# Patient Record
Sex: Male | Born: 1955 | Race: White | Hispanic: No | Marital: Married | Smoking: Former smoker
Health system: Western US, Academic
[De-identification: ages and names within clinical notes are randomized; demographics above are authoritative.]

## PROBLEM LIST (undated history)

## (undated) DIAGNOSIS — M1990 Unspecified osteoarthritis, unspecified site: Secondary | ICD-10-CM

## (undated) DIAGNOSIS — C801 Malignant (primary) neoplasm, unspecified: Secondary | ICD-10-CM

## (undated) DIAGNOSIS — I10 Essential (primary) hypertension: Secondary | ICD-10-CM

## (undated) DIAGNOSIS — M549 Dorsalgia, unspecified: Secondary | ICD-10-CM

## (undated) HISTORY — DX: Essential (primary) hypertension: I10

## (undated) HISTORY — PX: HIP ARTHROPLASTY: SHX981

## (undated) HISTORY — DX: Dorsalgia, unspecified: M54.9

## (undated) MED ORDER — ATORVASTATIN CALCIUM 20 MG OR TABS
20.0000 mg | ORAL_TABLET | Freq: Every day | ORAL | 0 refills | Status: AC
Start: 2023-03-31 — End: ?

## (undated) MED ORDER — ATORVASTATIN CALCIUM 40 MG OR TABS
40.00 mg | ORAL_TABLET | Freq: Every day | ORAL | 0 refills | Status: AC
Start: 2020-07-17 — End: ?

## (undated) MED ORDER — POTASSIUM CHLORIDE CRYS CR 20 MEQ OR TBCR
20.00 meq | EXTENDED_RELEASE_TABLET | Freq: Every day | ORAL | 2 refills | Status: AC
Start: 2019-08-27 — End: ?

## (undated) MED ORDER — CHLORTHALIDONE 50 MG OR TABS
ORAL_TABLET | ORAL | 0 refills | Status: AC
Start: 2021-10-25 — End: ?

## (undated) MED ORDER — ATORVASTATIN CALCIUM 20 MG OR TABS
20.0000 mg | ORAL_TABLET | Freq: Every day | ORAL | 0 refills | Status: AC
Start: 2023-03-24 — End: ?

## (undated) MED ORDER — LEVOTHYROXINE SODIUM 50 MCG OR TABS
ORAL_TABLET | ORAL | 0 refills | Status: AC
Start: 2020-04-22 — End: ?

## (undated) MED ORDER — DIAZEPAM 5 MG OR TABS
ORAL_TABLET | ORAL | 0 refills | Status: AC
Start: 2021-07-26 — End: ?

## (undated) MED ORDER — ATORVASTATIN CALCIUM 40 MG OR TABS
ORAL_TABLET | ORAL | 0 refills | Status: AC
Start: 2020-07-23 — End: ?

## (undated) MED ORDER — COLCHICINE 0.6 MG OR TABS
ORAL_TABLET | ORAL | 0 refills | Status: AC
Start: 2019-06-11 — End: ?

## (undated) MED ORDER — TRAMADOL HCL 50 MG OR TABS
50.0000 mg | ORAL_TABLET | Freq: Four times a day (QID) | ORAL | 0 refills | Status: AC | PRN
Start: 2021-07-28 — End: ?

## (undated) MED ORDER — METOPROLOL SUCCINATE 200 MG OR TB24
ORAL_TABLET | ORAL | 0 refills | Status: AC
Start: 2019-08-20 — End: ?

## (undated) MED ORDER — CHLORTHALIDONE 50 MG OR TABS
ORAL_TABLET | ORAL | 0 refills | Status: AC
Start: 2021-11-06 — End: ?

## (undated) MED ORDER — LEVOTHYROXINE SODIUM 50 MCG OR TABS
50.00 ug | ORAL_TABLET | Freq: Every day | ORAL | Status: AC
Start: 2018-04-24 — End: ?

## (undated) MED ORDER — COLCHICINE 0.6 MG OR TABS
ORAL_TABLET | ORAL | 0 refills | Status: AC
Start: 2019-06-15 — End: ?

## (undated) MED ORDER — ATORVASTATIN CALCIUM 20 MG OR TABS
20.0000 mg | ORAL_TABLET | Freq: Every day | ORAL | 0 refills | Status: AC
Start: 2023-03-28 — End: ?

## (undated) MED ORDER — ATORVASTATIN CALCIUM 40 MG OR TABS
40.00 mg | ORAL_TABLET | Freq: Every day | ORAL | 0 refills | Status: AC
Start: 2020-09-04 — End: ?

## (undated) MED ORDER — CHLORTHALIDONE 50 MG OR TABS
50.0000 mg | ORAL_TABLET | Freq: Every day | ORAL | 3 refills | Status: AC
Start: 2021-10-29 — End: ?

## (undated) MED ORDER — TRAMADOL HCL 50 MG OR TABS
ORAL_TABLET | ORAL | 0 refills | Status: AC
Start: 2022-01-10 — End: ?

## (undated) MED ORDER — TRAMADOL HCL 50 MG OR TABS
50.0000 mg | ORAL_TABLET | Freq: Four times a day (QID) | ORAL | 0 refills | Status: AC | PRN
Start: 2022-01-11 — End: ?

## (undated) MED ORDER — COLCHICINE 0.6 MG OR TABS
0.60 mg | ORAL_TABLET | Freq: Every day | ORAL | Status: AC
Start: 2020-08-31 — End: ?

## (undated) MED ORDER — ATORVASTATIN CALCIUM 40 MG OR TABS
ORAL_TABLET | ORAL | 0 refills | Status: AC
Start: 2020-04-22 — End: ?

---

## 1999-12-20 HISTORY — PX: VASECTOMY: SHX75

## 2003-06-10 LAB — HM COLONOSCOPY

## 2007-12-20 HISTORY — PX: COLONOSCOPY: SHX174

## 2008-06-09 ENCOUNTER — Ambulatory Visit: Payer: Self-pay | Admitting: Gastroenterology

## 2010-11-29 ENCOUNTER — Ambulatory Visit: Payer: Self-pay

## 2011-09-27 ENCOUNTER — Ambulatory Visit: Payer: Self-pay | Admitting: Family Medicine

## 2012-05-09 ENCOUNTER — Ambulatory Visit: Payer: Self-pay | Admitting: Family Medicine

## 2015-01-13 LAB — BASIC METABOLIC PANEL
BUN: 14 mg/dL (ref 4–21)
Creatinine: 1.2 mg/dL (ref 0.6–1.3)
GLUCOSE: 102 mg/dL
Potassium: 4.5 mmol/L (ref 3.4–5.3)
Sodium: 142 mmol/L (ref 137–147)

## 2015-01-13 LAB — HEPATIC FUNCTION PANEL
ALT: 30 U/L (ref 10–40)
AST: 27 U/L (ref 14–40)

## 2015-01-13 LAB — CBC AND DIFFERENTIAL
HCT: 46 % (ref 41–53)
Hemoglobin: 16 g/dL (ref 13.5–17.5)
Platelets: 191 10*3/uL (ref 150–399)
WBC: 5.9 10^3/mL

## 2015-01-13 LAB — TSH: TSH: 7.08 u[IU]/mL — AB (ref 0.41–5.90)

## 2015-08-10 ENCOUNTER — Ambulatory Visit (INDEPENDENT_AMBULATORY_CARE_PROVIDER_SITE_OTHER): Payer: 59 | Admitting: Family Medicine

## 2015-08-10 ENCOUNTER — Encounter: Payer: Self-pay | Admitting: Family Medicine

## 2015-08-10 VITALS — BP 120/80 | HR 77 | Temp 98.3°F | Resp 16 | Ht 76.0 in | Wt 234.0 lb

## 2015-08-10 DIAGNOSIS — M542 Cervicalgia: Secondary | ICD-10-CM

## 2015-08-10 DIAGNOSIS — E785 Hyperlipidemia, unspecified: Secondary | ICD-10-CM | POA: Insufficient documentation

## 2015-08-10 DIAGNOSIS — R41840 Attention and concentration deficit: Secondary | ICD-10-CM | POA: Insufficient documentation

## 2015-08-10 DIAGNOSIS — M79673 Pain in unspecified foot: Secondary | ICD-10-CM | POA: Insufficient documentation

## 2015-08-10 MED ORDER — CYCLOBENZAPRINE HCL 5 MG PO TABS: 5.0000 mg | ORAL_TABLET | Freq: Three times a day (TID) | ORAL | Status: DC | PRN

## 2015-08-10 NOTE — Progress Notes (Signed)
Patient: Tristan Clark Male    DOB: March 20, 1956   59 y.o.   MRN: [id number] Visit Date: 08/10/2015  Today's Provider: Lelon Huh, MD   Chief Complaint  Patient presents with  . Neck Pain   Subjective:    Neck Pain  This is a new problem. The current episode started in the past 7 days. The problem occurs constantly. The problem has been gradually worsening. The pain is associated with an unknown factor. The pain is present in the left side. The quality of the pain is described as shooting. The pain is at a severity of 3/10. The pain is mild. The symptoms are aggravated by coughing, bending, sneezing and twisting. Pertinent negatives include no chest pain, headaches, numbness, pain with swallowing, tingling or weakness. He has tried NSAIDs for the symptoms. The treatment provided mild relief.       Allergies  Allergen Reactions  . Amoxicillin Rash   Previous Medications   ATOMOXETINE (STRATTERA) 80 MG CAPSULE    Take 1 capsule by mouth daily. According to starter pack   EPINEPHRINE (EPIPEN 2-PAK) 0.3 MG/0.3 ML IJ SOAJ INJECTION    Inject 1 Dose as directed as needed. For severe allergic reations   KRILL OIL 300 MG CAPS    Take 1 capsule by mouth daily.   LORATADINE (CLARITIN) 10 MG TABLET    Take 1 tablet by mouth daily.   RED YEAST RICE 600 MG TABS    Take 1 tablet by mouth daily.   VALACYCLOVIR (VALTREX) 1000 MG TABLET    Take 1 tablet by mouth 2 (two) times daily as needed.    Review of Systems  Cardiovascular: Negative for chest pain and palpitations.  Musculoskeletal: Positive for neck pain and neck stiffness. Negative for myalgias, back pain and joint swelling.  Neurological: Negative for dizziness, tingling, speech difficulty, weakness, light-headedness, numbness and headaches.  Psychiatric/Behavioral: Positive for sleep disturbance.    Social History  Substance Use Topics  . Smoking status: Former Research scientist (life sciences)  . Smokeless tobacco: Not on file  . Alcohol Use: 0.0  oz/week    0 Standard drinks or equivalent per week     Comment: occasional use;  2-3 times a week drinks a Bourbon   Objective:   BP 120/80 mmHg  Pulse 77  Temp(Src) 98.3 F (36.8 C) (Oral)  Resp 16  Ht 6\' 4"  (1.93 m)  Wt 234 lb (106.142 kg)  BMI 28.50 kg/m2  SpO2 97%  Physical Exam   General Appearance:    Alert, cooperative, no distress  Eyes:    PERRL, conjunctiva/corneas clear, EOM's intact       Lungs:     Clear to auscultation bilaterally, respirations unlabored  Heart:    Regular rate and rhythm  Neurologic:   Awake, alert, oriented x 3. No apparent focal neurological           defect.   Musc:   Tender along superior aspect of left scapula and adjacent trapezius. Pain elicited with head rotation to right and right lateral flexion. No swelling, no gross deformities. FROM of neck and shoulder. Negative impingement sign. Normal somato-sensation.        Assessment & Plan:     1. Neck pain on left side Likely muscle strain. Can take OTC NSAID per label during the day. Apply heat 8-10 minutes twice a day and take muscle relaxer hs. Call if not greatly improved in 7-10 days.  - cyclobenzaprine (FLEXERIL) 5 MG  tablet; Take 1 tablet (5 mg total) by mouth 3 (three) times daily as needed for muscle spasms.  Dispense: 30 tablet; Refill: 0         Lelon Huh, MD  [facility]

## 2015-08-21 ENCOUNTER — Ambulatory Visit (INDEPENDENT_AMBULATORY_CARE_PROVIDER_SITE_OTHER): Payer: 59 | Admitting: Family Medicine

## 2015-08-21 ENCOUNTER — Telehealth: Payer: Self-pay | Admitting: Family Medicine

## 2015-08-21 ENCOUNTER — Ambulatory Visit
Admission: RE | Admit: 2015-08-21 | Discharge: 2015-08-21 | Disposition: A | Discharge status: Home / Self Care | Payer: 59 | Source: Ambulatory Visit | Attending: Family Medicine | Admitting: Family Medicine

## 2015-08-21 ENCOUNTER — Encounter: Payer: Self-pay | Admitting: Family Medicine

## 2015-08-21 VITALS — BP 136/88 | HR 52 | Temp 98.7°F | Resp 14 | Wt 237.8 lb

## 2015-08-21 DIAGNOSIS — M5032 Other cervical disc degeneration, mid-cervical region: Secondary | ICD-10-CM | POA: Insufficient documentation

## 2015-08-21 DIAGNOSIS — I6522 Occlusion and stenosis of left carotid artery: Secondary | ICD-10-CM | POA: Insufficient documentation

## 2015-08-21 DIAGNOSIS — M542 Cervicalgia: Secondary | ICD-10-CM

## 2015-08-21 NOTE — Telephone Encounter (Signed)
We do have results back. Please review. Thanks!

## 2015-08-21 NOTE — Progress Notes (Signed)
Patient ID: Tristan Clark, male   DOB: 29-Aug-1956, 59 y.o.   MRN: [id number]   Patient: Tristan Clark Male    DOB: [birth year]   59 y.o.   MRN: [id number] Visit Date: 08/21/2015  Today's Provider: Vernie Murders, PA   Chief Complaint  Patient presents with  . Follow-up    left shoulder and arm pain. patient was seen on 08/10/15 pain is not better with muscle relaxers   Subjective:    HPI Patient C/O of left arm and shoulder pain. Patient was last seen in office on 08/10/15. Patient was prescribed Flexeril. Patient states medication is not helping with pain very much but only take it at night. Discomfort described as sharp pain intermittently. Took Flexeril, Ibuprofen and Naproxen this morning. No pain at the present. No muscle weakness. 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 Pain returns at rest.     Patient Active Problem List   Diagnosis Date Noted  . Hyperlipidemia 08/10/2015  . Poor concentration 08/10/2015  . Heel pain 08/10/2015  . Osteoarthrosis, hand 09/10/2009  . Pure hypercholesterolemia 09/10/2009  . Allergic rhinitis 03/05/2007  . Hearing loss 03/05/2007   Past Surgical History  Procedure Laterality Date  . Vasectomy  2001  . Colonoscopy  2009    Normal; unsure of MD   Family History  Problem Relation Age of Onset  . Arthritis Sister   . Alcohol abuse Brother   . Cancer Maternal Grandmother    Allergies  Allergen Reactions  . Amoxicillin Rash    Previous Medications   ATOMOXETINE (STRATTERA) 80 MG CAPSULE    Take 1 capsule by mouth daily. According to starter pack   CYCLOBENZAPRINE (FLEXERIL) 5 MG TABLET    Take 1 tablet (5 mg total) by mouth 3 (three) times daily as needed for muscle spasms.   EPINEPHRINE (EPIPEN 2-PAK) 0.3 MG/0.3 ML IJ SOAJ INJECTION    Inject 1 Dose as directed as needed. For severe allergic reations   KRILL OIL 300 MG CAPS    Take 1 capsule by mouth daily.   LORATADINE (CLARITIN) 10 MG TABLET    Take 1 tablet by mouth daily.   RED YEAST RICE 600 MG TABS    Take 1 tablet by mouth daily.   VALACYCLOVIR (VALTREX) 1000 MG TABLET    Take 1 tablet by mouth 2 (two) times daily as needed.   Review of Systems  Constitutional: Negative.   HENT: Negative.   Respiratory: Negative.   Cardiovascular: Negative.   Gastrointestinal: Negative.   Genitourinary: Negative.   Musculoskeletal: Positive for myalgias and back pain.    Social History  Substance Use Topics  . Smoking status: Former Research scientist (life sciences)  . Smokeless tobacco: Not on file  . Alcohol Use: 0.0 oz/week    0 Standard drinks or equivalent per week     Comment: occasional use;  2-3 times a week drinks a Bourbon   Objective:   BP 136/88 mmHg  Pulse 52  Temp(Src) 98.7 F (37.1 C) (Oral)  Resp 14  Wt 237 lb 12.8 oz (107.865 kg)  SpO2 97%  Physical Exam  Constitutional: He is oriented to person, place, and time. He appears well-developed and well-nourished. No distress.  HENT:  Head: Normocephalic and atraumatic.  Right Ear: Hearing and external ear normal.  Left Ear: Hearing and external ear normal.  Nose: Nose normal.  Eyes: Conjunctivae, EOM and lids are normal. Right eye exhibits no discharge. Left eye exhibits no discharge. No scleral icterus.  Neck: Neck  supple.  Pulmonary/Chest: Effort normal. No respiratory distress.  Musculoskeletal: Normal range of motion.  Discomfort in upper back over the left medial scapular border and into the left axilla. Feel sharper pain with tilting head to the left or rotating head to the right. Good upper extremity strength. Some axillary discomfort to test deltoid strength.   Neurological: He is alert and oriented to person, place, and time. He has normal reflexes.  Skin: Skin is intact. No lesion and no rash noted.  Psychiatric: He has a normal mood and affect. His speech is normal and behavior is normal. Thought content normal.      Assessment & Plan:     1. Neck pain on left side Onset over 2 weeks ago. Still having  pains in left posterior shoulder radiating to the left upper arm and axilla. No weakness or numbness. Will get x-ray evaluation and continue Ibuprofen 200 mg 3-4 tablets TID with food and Flexeril up to TID as needed. Don't recommend using Naproxen with the Ibuprofen. May use TENS unit on shoulder as needed. Recheck pending reports. - DG Cervical Spine Complete

## 2015-08-21 NOTE — Telephone Encounter (Signed)
Pt called wanting to know if we have gotten his results back yet from the xray this am.  Call back number is [phone]  Thanks Con Memos

## 2015-08-21 NOTE — Telephone Encounter (Signed)
Advised patient of xray results.  °

## 2015-08-25 ENCOUNTER — Telehealth: Payer: Self-pay

## 2015-08-25 NOTE — Telephone Encounter (Signed)
Patient advised as directed below. Patient  Verbalized understanding.  Thanks,  -Saina Waage

## 2015-08-25 NOTE — Telephone Encounter (Signed)
-----   Message from Margo Common, [place] sent at 08/21/2015  2:33 PM EDT ----- X-ray of neck confirms arthritis with some spurs in cervical vertebra. May use moist heat, TENS unit (OTC) and Ibuprofen 200 mg 3-4 tablets TID with food. If not responding well, may need prednisone taper. Call report of response in 7-10 days - or as needed.

## 2015-08-28 ENCOUNTER — Telehealth: Payer: Self-pay | Admitting: Family Medicine

## 2015-08-28 MED ORDER — PREDNISONE 10 MG PO TABS: 10.0000 mg | ORAL_TABLET | Freq: Every day | ORAL | Status: DC

## 2015-08-28 NOTE — Telephone Encounter (Signed)
Persistent shoulder pain. Will give prednisone taper and recheck in 2 weeks if needed.

## 2015-08-28 NOTE — Telephone Encounter (Signed)
Pt called saying having pain in shoulder and blade.Marland Kitchen  He said you talked about giving prednisone.  He would like for you to do this.  He uses [facility].  Call back is [phone]  Thanks Con Memos

## 2015-09-07 ENCOUNTER — Ambulatory Visit (INDEPENDENT_AMBULATORY_CARE_PROVIDER_SITE_OTHER): Payer: 59 | Admitting: Family Medicine

## 2015-09-07 ENCOUNTER — Encounter: Payer: Self-pay | Admitting: Family Medicine

## 2015-09-07 VITALS — BP 132/76 | HR 84 | Temp 98.1°F | Resp 16 | Wt 234.0 lb

## 2015-09-07 DIAGNOSIS — M129 Arthropathy, unspecified: Secondary | ICD-10-CM

## 2015-09-07 DIAGNOSIS — M79602 Pain in left arm: Secondary | ICD-10-CM

## 2015-09-07 DIAGNOSIS — M47812 Spondylosis without myelopathy or radiculopathy, cervical region: Secondary | ICD-10-CM

## 2015-09-07 MED ORDER — PREDNISONE 10 MG PO TABS: 10.0000 mg | ORAL_TABLET | Freq: Every day | ORAL | Status: DC

## 2015-09-07 NOTE — Progress Notes (Signed)
Subjective:    Patient ID: Tristan Clark, male    DOB: 03-15-1956, 59 y.o.   MRN: [id number] Chief Complaint  Patient presents with  . Arm Pain    Left arm   HPI This 59 year old male continues to have discomfort and tingling in the left arm after using Ibuprofen and a TENS unit (OTC). X-rays showed some cervical spurring. Feels more tingling in the inside of the left upper arm. Denies weakness in arms or hands. Patient Active Problem List   Diagnosis Date Noted  . Hyperlipidemia 08/10/2015  . Poor concentration 08/10/2015  . Heel pain 08/10/2015  . Osteoarthrosis, hand 09/10/2009  . Pure hypercholesterolemia 09/10/2009  . Allergic rhinitis 03/05/2007  . Hearing loss 03/05/2007   Past Surgical History  Procedure Laterality Date  . Vasectomy  2001  . Colonoscopy  2009    Normal; unsure of MD   Family History  Problem Relation Age of Onset  . Arthritis Sister   . Alcohol abuse Brother   . Cancer Maternal Grandmother    Social History  Substance Use Topics  . Smoking status: Former Research scientist (life sciences)  . Smokeless tobacco: None  . Alcohol Use: 0.0 oz/week    0 Standard drinks or equivalent per week     Comment: occasional use;  2-3 times a week drinks a Bourbon   Allergies  Allergen Reactions  . Amoxicillin Rash   Current Outpatient Prescriptions on File Prior to Visit  Medication Sig Dispense Refill  . atomoxetine (STRATTERA) 80 MG capsule Take 1 capsule by mouth daily. According to starter pack    . cyclobenzaprine (FLEXERIL) 5 MG tablet Take 1 tablet (5 mg total) by mouth 3 (three) times daily as needed for muscle spasms. 30 tablet 0  . EPINEPHrine (EPIPEN 2-PAK) 0.3 mg/0.3 mL IJ SOAJ injection Inject 1 Dose as directed as needed. For severe allergic reations    . Krill Oil 300 MG CAPS Take 1 capsule by mouth daily.    Marland Kitchen loratadine (CLARITIN) 10 MG tablet Take 1 tablet by mouth daily.    . Red Yeast Rice 600 MG TABS Take 1 tablet by mouth daily.    . valACYclovir (VALTREX)  1000 MG tablet Take 1 tablet by mouth 2 (two) times daily as needed.     No current facility-administered medications on file prior to visit.   Review of Systems  Constitutional: Negative.   Respiratory: Negative.   Cardiovascular: Negative.   Musculoskeletal: Positive for neck pain.       Improved ache in posterior neck. X-ray of C-spine on 08-21-15 showed chronic disc and endplate degeneration with spurring of C5-C6.  Neurological: Positive for numbness. Negative for dizziness and weakness.       Tingling in the left upper arm and fingers occasionally.      BP 132/76 mmHg  Pulse 84  Temp(Src) 98.1 F (36.7 C) (Oral)  Resp 16  Wt 234 lb (106.142 kg)  SpO2 97%   Objective:   Physical Exam  Constitutional: He is oriented to person, place, and time. He appears well-developed and well-nourished.  Eyes: Conjunctivae are normal.  Neck: No thyromegaly present.  Slight stiffness without pain to palpation in neck. Occasional discomfort and tingling into the left hand and upper arm to tilt head to the right.  Cardiovascular: Normal rate and regular rhythm.   Pulmonary/Chest: Breath sounds normal.  Musculoskeletal: Normal range of motion.  No crepitus or pain to palpate shoulders or joints of the upper extremities. Pulses  are 2+ and symmetric.  Lymphadenopathy:    He has no cervical adenopathy.  Neurological: He is oriented to person, place, and time. He has normal reflexes.  Tingling in the left thumb, index and middle fingers to test Phalen sign. Negative Tinel. Good upper extremity strength bilaterally. Normal grip strength. Full ROM of both arms and shoulders. DTR's symmetric.      Assessment & Plan:  1. Pain of left upper extremity Recurrent discomfort as an aching pain with tingling into the left upper inner arm and hand. Suspect cervical arthropathy with radiculitis. No hand or arm weakness. Normal motor function.  2. Arthropathy of cervical spine X-rays on 08-21-15 showed C5-C6  chronic disc and endplate degeneration with spurring. Was a little better when he finished the 6 day prednisone taper, but, the discomfort and tingling has returned frequently. Feels more discomfort with forward flexion (hunching shoulders at desk work) for prolonged periods. Discomfort goes away when he reclines with his head supported. No longer getting much relief with the Ibuprofen now. Will proceed with the OTC TENS unit and give a 12 day dose pack of Prednisone. Also, schedule physical therapy evaluation for home cervical traction unit. May need referral to neurosurgeon if no better in 2-3 weeks. - predniSONE (DELTASONE) 10 MG tablet; Take 1 tablet (10 mg total) by mouth daily with breakfast. Taper every other day by one tablet (6,6,5,5,4,4,3,3,2,2,1,1)  Dispense: 42 tablet; Refill: 0 - Ambulatory referral to Physical Therapy

## 2015-09-28 ENCOUNTER — Encounter: Payer: Self-pay | Admitting: Physical Therapy

## 2015-09-28 ENCOUNTER — Ambulatory Visit: Payer: 59 | Attending: Family Medicine | Admitting: Physical Therapy

## 2015-09-28 DIAGNOSIS — M25512 Pain in left shoulder: Secondary | ICD-10-CM | POA: Insufficient documentation

## 2015-09-28 NOTE — Therapy (Signed)
[place] MAIN [facility] SERVICES [address] [place], [place], [address] Phone: [phone]   Fax:  [phone]  Physical Therapy Evaluation  Patient Details  Name: Tristan Clark MRN: [id number] Date of Birth: 04-17-1956 Referring Provider:  Margo Common, [place]  Encounter Date: 09/28/2015      PT End of Session - 09/28/15 1351    Visit Number 1   Number of Visits 17   Date for PT Re-Evaluation 11/23/15   PT Start Time 0105   PT Stop Time 0200   PT Time Calculation (min) 55 min   Activity Tolerance Patient limited by pain   Behavior During Therapy [facility] [place] for tasks assessed/performed      History reviewed. No pertinent past medical history.  Past Surgical History  Procedure Laterality Date  . Vasectomy  2001  . Colonoscopy  2009    Normal; unsure of MD    There were no vitals filed for this visit.  Visit Diagnosis:  Left shoulder pain      Subjective Assessment - 09/28/15 1312    Subjective Patient is having constant pain  1/10 to 5/10  left shoulder.    Currently in Pain? Yes   Pain Score 5    Pain Location Shoulder   Pain Orientation Left   Pain Descriptors / Indicators Aching;Constant;Stabbing   Pain Type Acute pain   Pain Onset More than a month ago   Pain Frequency Constant            [facility] PT Assessment - 09/28/15 1314    Assessment   Medical Diagnosis left shoulder pain   Onset Date/Surgical Date 07/20/15   Hand Dominance Right   Prior Therapy no   Balance Screen   Has the patient fallen in the past 6 months No   Has the patient had a decrease in activity level because of a fear of falling?  No   Is the patient reluctant to leave their home because of a fear of falling?  No   Home Ecologist residence   Living Arrangements Spouse/significant other   Available Help at Discharge Family   Type of [place] to enter   Entrance Stairs-Number of Steps 3    Entrance Stairs-Rails None   Home Layout Two level   [place] None   Prior Function   Level of [place] Independent   Vocation Full time employment         PAIN: 3-6/10 left shoulder   POSTURE:  Fwd head   PROM/AROM: WNL BUE shoulders  grip 48 Left, 54 right lbs STRENGTH:  Graded on a 0-5 scale Muscle Group Left Right  Shoulder flex 5 5  Shoulder Abd 5 5  Shoulder Ext 5 5  Shoulder IR/ER 5 5  Elbow 5 5  Wrist/hand 5 5  Hip Flex    Hip Abd    Hip Add    Hip Ext    Hip IR/ER    Knee Flex    Knee Ext    Ankle DF    Ankle PF     SENSATION:  Numbness and tingling left shoulder to elbow that is intermittent             AROM cervical Neck flex 20  Neck ext 50 Neck SBL 35 Neck SBR  35 Neck rotaiton left  50 Neck rotation right 60   SPECIAL TESTS:  + spurlings   Mechanical  traction: 25 lbs/10 x 10 mins cervical traction with decreased left arm pain to 0/10 but still has numbness in hand                                   PT Long Term Goals - 09/28/15 1352    PT LONG TERM GOAL #1   Title  Patient will be able to perform household work/ chores without increase in symptoms 11/23/15   PT LONG TERM GOAL #2   Title Patient will report a worst pain of 3/10 on VAS in      left shoulder       to improve tolerance with ADLs and reduced symptoms with activities 11/23/15   PT LONG TERM GOAL #3   Title Patient will improve AROM neck  so they are able to perform overhead ADL's such as reaching into cabinets, and working with objects overhead. 11/23/15               Plan - 09/28/15 1333    Clinical Impression Statement Patinet is 37 and has constant pain that does wake  him up at night, and gets worse as the day goes by. He has decreased cervical ROM with pain during active and passive neck flex/ext/RSB/Rrotation. ROM and strength is WFL. Patient is tender to palpation in T 4-5  and  left rhomboid  muscles in left scapula  muscles. Patietn has tingling in left arm that is itermittent and associated with neck movement.    Pt will benefit from skilled therapeutic intervention in order to improve on the following deficits Postural dysfunction;Impaired flexibility;Decreased range of motion;Pain   Rehab Potential Good   PT Frequency 2x / week   PT Duration 8 weeks   PT Treatment/Interventions Traction;Moist Heat;Electrical Stimulation;Cryotherapy;Ultrasound;Therapeutic activities;Therapeutic exercise;Manual techniques;Passive range of motion   PT Next Visit Plan thoracic manual therapy, chin tucks, progress HEP   PT Home Exercise Plan chin tucks   Consulted and Agree with Plan of Care Patient         Problem List Patient Active Problem List   Diagnosis Date Noted  . Hyperlipidemia 08/10/2015  . Poor concentration 08/10/2015  . Heel pain 08/10/2015  . Osteoarthrosis, hand 09/10/2009  . Pure hypercholesterolemia 09/10/2009  . Allergic rhinitis 03/05/2007  . Hearing loss 03/05/2007    Tristan Clark 09/28/2015, 2:03 PM  [place] MAIN [facility] SERVICES [address] [place], [place], [address] Phone: [phone]   Fax:  [phone]

## 2015-09-30 ENCOUNTER — Encounter: Payer: 59 | Admitting: Physical Therapy

## 2015-09-30 ENCOUNTER — Ambulatory Visit: Payer: 59

## 2015-09-30 DIAGNOSIS — M25512 Pain in left shoulder: Secondary | ICD-10-CM | POA: Diagnosis not present

## 2015-10-01 NOTE — Therapy (Signed)
[place] MAIN [facility] SERVICES [address] [place], [place], [address] Phone: [phone]   Fax:  [phone]  Physical Therapy Treatment  Patient Details  Name: Tristan Clark MRN: [id number] Date of Birth: 03-25-1956 Referring Provider:  Margo Common, PA  Encounter Date: 09/30/2015      PT End of Session - 10/01/15 1107    Visit Number 2   Number of Visits 17   Date for PT Re-Evaluation 11/23/15   PT Start Time 3790   PT Stop Time 1735   PT Time Calculation (min) 50 min   Activity Tolerance Patient tolerated treatment well   Behavior During Therapy [facility] for tasks assessed/performed      History reviewed. No pertinent past medical history.  Past Surgical History  Procedure Laterality Date  . Vasectomy  2001  . Colonoscopy  2009    Normal; unsure of MD    There were no vitals filed for this visit.  Visit Diagnosis:  Left shoulder pain      Subjective Assessment - 10/01/15 1104    Subjective pt relates he has been performing chin tucks on a daily basis and notes slight improvement in his symptoms.  pt experienced decreased numbness/tingling when performing chin tucks but experiences increased pain in his UE.  pt notes his pain is intermittent throughout the day.     Currently in Pain? Yes   Pain Score 1    Pain Location Shoulder   Pain Orientation Left   Pain Onset More than a month ago     manual therapy: There ex: Chin tucks 2x10, pt required verbal, visual and tactile cues for correct technique  Left levator stretch x20 sec, increased in tingling in his left arm Chin tucks with extension x10, decreased numbness but increased pain in left arm and in medial boarder of scapula  Pt required at least mod verbal, visual and tactile cues for correct technique   Manual therapy: Cervical P/A mobs grade 2-3, 2x25 seconds each level,  Left lateral cervical glide grade 2-3 2x25 each level, pain in left arm level  C5-6 Right lateral cervical glide grade 2-3 2x25 each level,pain  in left arm level C5-6 Thoracic P/A MOBS grade 2-3 2x25 sec T1-7l, increased pain  in left arm T1-T4 and decreased numbness  Passive cervical retractions x10 decreased numbness Passive cervical retractions with extension x10, decreased numbness and increased pain in left arm Cervical distraction x2 min with 10 sec pull/10 sec relax, no change in symptoms   Pt reported no change in symptoms at the end of the session.                               PT Long Term Goals - 09/28/15 1352    PT LONG TERM GOAL #1   Title  Patient will be able to perform household work/ chores without increase in symptoms 11/23/15   PT LONG TERM GOAL #2   Title Patient will report a worst pain of 3/10 on VAS in      left shoulder       to improve tolerance with ADLs and reduced symptoms with activities 11/23/15   PT LONG TERM GOAL #3   Title Patient will improve AROM neck  so they are able to perform overhead ADL's such as reaching into cabinets, and working with objects overhead. 11/23/15  Plan - 10/01/15 1221    Clinical Impression Statement Pt experienced variable results with today's session.  pt experienced increased numbness/tingling during cervical P/A and lateral glides but decreased shoulder pain and experienced decreased numbness/tingling with cervical retractions actively and passively but increased arm/shoulder pain.  pt does present with increased tension in his levator scapulae which decreased after levator scapulae stretch but experienced increased numbness/tingling in his arm.  pt would benefit from skilled PT services to improve symptoms and to assess possible thoracic outlet syndrome and neuro tension.   Pt will benefit from skilled therapeutic intervention in order to improve on the following deficits Postural dysfunction;Impaired flexibility;Decreased range of motion;Pain   Rehab Potential Good    PT Frequency 2x / week   PT Duration 8 weeks   PT Treatment/Interventions Traction;Moist Heat;Electrical Stimulation;Cryotherapy;Ultrasound;Therapeutic activities;Therapeutic exercise;Manual techniques;Passive range of motion   PT Next Visit Plan thoracic manual therapy, chin tucks, progress HEP   Consulted and Agree with Plan of Care Patient        Problem List Patient Active Problem List   Diagnosis Date Noted  . Hyperlipidemia 08/10/2015  . Poor concentration 08/10/2015  . Heel pain 08/10/2015  . Osteoarthrosis, hand 09/10/2009  . Pure hypercholesterolemia 09/10/2009  . Allergic rhinitis 03/05/2007  . Hearing loss 03/05/2007   Renford Dills, SPT This entire session was performed under direct supervision and direction of a licensed therapist/therapist assistant . I have personally read, edited and approve of the note as written. Gorden Harms. Tortorici, PT, DPT [phone]  Tortorici,Ashley 10/01/2015, 1:19 PM  [place] MAIN [facility] SERVICES [address] [place], [place], [address] Phone: [phone]   Fax:  [phone]

## 2015-10-01 NOTE — Patient Instructions (Signed)
[email or web address] Left levator scapulae stertch 2x10 sec within pain free range

## 2015-10-05 ENCOUNTER — Ambulatory Visit: Payer: 59 | Admitting: Physical Therapy

## 2015-10-05 ENCOUNTER — Encounter: Payer: 59 | Admitting: Physical Therapy

## 2015-10-05 ENCOUNTER — Encounter: Payer: Self-pay | Admitting: Physical Therapy

## 2015-10-05 DIAGNOSIS — M25512 Pain in left shoulder: Secondary | ICD-10-CM | POA: Diagnosis not present

## 2015-10-05 NOTE — Therapy (Signed)
[place] [facility]IN [facility] SERVICES [address] [place], [place], [address] Phone: [phone]   Fax:  [phone]  Physical Therapy Treatment  Patient Details  Name: Tristan Clark MRN: [id number] Date of Birth: 1956/08/04 No Data Recorded  Encounter Date: 10/05/2015      PT End of Session - 10/05/15 1713    Visit Number 3   Number of Visits 17   Date for PT Re-Evaluation 11/23/15   PT Start Time 0500   PT Stop Time 0545   PT Time Calculation (min) 45 min   Activity Tolerance Patient tolerated treatment well   Behavior During Therapy [facility] for tasks assessed/performed      History reviewed. No pertinent past medical history.  Past Surgical History  Procedure Laterality Date  . Vasectomy  2001  . Colonoscopy  2009    Normal; unsure of MD    There were no vitals filed for this visit.  Visit Diagnosis:  Left shoulder pain      Subjective Assessment - 10/05/15 1712    Subjective Patient is having less pain today 2/10 and it is in his left scapula musculature.    Pain Onset More than a month ago      Cervical traction 10 sec rest/20 sec pull with 25 lbs x 20 mins Repeated motions for cervical ROM Cervical AROM  Rotation left and right and SB L , SBR Reviewed HEP and stretching.  Cervical rotation to the left increased the tingling to left hand, cervical rotation to the right side made the tingling decrease in left hand,                            PT Education - 10/05/15 1713    Education provided Yes   Person(s) Educated Patient   Methods Explanation   Comprehension Verbalized understanding             PT Long Term Goals - 09/28/15 1352    PT LONG TERM GOAL #1   Title  Patient will be able to perform household work/ chores without increase in symptoms 11/23/15   PT LONG TERM GOAL #2   Title Patient will report a worst pain of 3/10 on VAS in      left shoulder       to improve tolerance with  ADLs and reduced symptoms with activities 11/23/15   PT LONG TERM GOAL #3   Title Patient will improve AROM neck  so they are able to perform overhead ADL's such as reaching into cabinets, and working with objects overhead. 11/23/15               Plan - 10/05/15 1713    Clinical Impression Statement Patient has contined to have pain in left scapula muscle but has less pain and numbness in his UE's.    Pt will benefit from skilled therapeutic intervention in order to improve on the following deficits Postural dysfunction;Impaired flexibility;Decreased range of motion;Pain   Rehab Potential Good   PT Frequency 2x / week   PT Duration 8 weeks   PT Treatment/Interventions Traction;Moist Heat;Electrical Stimulation;Cryotherapy;Ultrasound;Therapeutic activities;Therapeutic exercise;Manual techniques;Passive range of motion   PT Next Visit Plan thoracic manual therapy, chin tucks, progress HEP   Consulted and Agree with Plan of Care Patient        Problem List Patient Active Problem List   Diagnosis Date Noted  . Hyperlipidemia 08/10/2015  . Poor concentration 08/10/2015  .  Heel pain 08/10/2015  . Osteoarthrosis, hand 09/10/2009  . Pure hypercholesterolemia 09/10/2009  . Allergic rhinitis 03/05/2007  . Hearing loss 03/05/2007  Alanson Puls, PT, DPT  [place], Minette Headland S 10/05/2015, 5:15 PM  [place] MAIN [facility] SERVICES [address] [place], [place], [address] Phone: [phone]   Fax:  [phone]  Name: DRAYTON TIEU MRN: [id number] Date of Birth: 04/23/1956

## 2015-10-07 ENCOUNTER — Encounter: Payer: 59 | Admitting: Physical Therapy

## 2015-10-08 ENCOUNTER — Ambulatory Visit: Payer: 59 | Admitting: Physical Therapy

## 2015-10-08 ENCOUNTER — Encounter: Payer: Self-pay | Admitting: Physical Therapy

## 2015-10-08 DIAGNOSIS — M25512 Pain in left shoulder: Secondary | ICD-10-CM | POA: Diagnosis not present

## 2015-10-08 NOTE — Therapy (Signed)
[place] MAIN [facility] SERVICES [address] [place], [place], [address] Phone: [phone]   Fax:  [phone]  Physical Therapy Treatment  Patient Details  Name: Tristan Clark MRN: [id number] Date of Birth: 08-10-1956 No Data Recorded  Encounter Date: 10/08/2015      PT End of Session - 10/08/15 1655    Visit Number 4   Number of Visits 17   Date for PT Re-Evaluation 11/23/15   PT Start Time 0445   PT Stop Time 0530   PT Time Calculation (min) 45 min   Activity Tolerance Patient tolerated treatment well   Behavior During Therapy [employer] for tasks assessed/performed      History reviewed. No pertinent past medical history.  Past Surgical History  Procedure Laterality Date  . Vasectomy  2001  . Colonoscopy  2009    Normal; unsure of MD    There were no vitals filed for this visit.  Visit Diagnosis:  Left shoulder pain      Subjective Assessment - 10/08/15 1654    Subjective Patient is having less pain today 0/10 and it is in his left scapula musculature.    Currently in Pain? No/denies   Pain Onset More than a month ago       Therapeutic exercise; UBE x 5 mins with increased symptoms in his left scapula muscle and tingling in his left elbow down to his left wrist Cervical traction x 17 minutes at 20 lbs 20 sec hold, 10 sec off Scapula retraction with RTB with cuing for correct posture Prone W exercise x 20 reps  Reviewed HEP of repeated left side rotation and SB left and ice to scapula muscles                          PT Education - 10/08/15 1655    Education provided Yes   Person(s) Educated Patient   Methods Explanation   Comprehension Verbalized understanding             PT Long Term Goals - 09/28/15 1352    PT LONG TERM GOAL #1   Title  Patient will be able to perform household work/ chores without increase in symptoms 11/23/15   PT LONG TERM GOAL #2   Title Patient will report a  worst pain of 3/10 on VAS in      left shoulder       to improve tolerance with ADLs and reduced symptoms with activities 11/23/15   PT LONG TERM GOAL #3   Title Patient will improve AROM neck  so they are able to perform overhead ADL's such as reaching into cabinets, and working with objects overhead. 11/23/15               Plan - 10/08/15 1656    Clinical Impression Statement Patient is having less pain in his neck and shoulder and is able to work and read with less symptoms. Patient responds well to traction and repeated exercises.    Pt will benefit from skilled therapeutic intervention in order to improve on the following deficits Postural dysfunction;Impaired flexibility;Decreased range of motion;Pain   Rehab Potential Good   PT Frequency 2x / week   PT Duration 8 weeks   PT Treatment/Interventions Traction;Moist Heat;Electrical Stimulation;Cryotherapy;Ultrasound;Therapeutic activities;Therapeutic exercise;Manual techniques;Passive range of motion   PT Next Visit Plan thoracic manual therapy, chin tucks, progress HEP   Consulted and Agree with Plan of Care Patient  Problem List Patient Active Problem List   Diagnosis Date Noted  . Hyperlipidemia 08/10/2015  . Poor concentration 08/10/2015  . Heel pain 08/10/2015  . Osteoarthrosis, hand 09/10/2009  . Pure hypercholesterolemia 09/10/2009  . Allergic rhinitis 03/05/2007  . Hearing loss 03/05/2007    Alanson Puls 10/08/2015, 5:26 PM  [place] MAIN [facility] SERVICES [address] [place], [place], [address] Phone: [phone]   Fax:  [phone]  Name: Tristan Clark MRN: [id number] Date of Birth: 1956-04-10

## 2015-10-12 ENCOUNTER — Encounter: Payer: Self-pay | Admitting: Physical Therapy

## 2015-10-12 ENCOUNTER — Ambulatory Visit: Payer: 59 | Admitting: Physical Therapy

## 2015-10-12 DIAGNOSIS — M25512 Pain in left shoulder: Secondary | ICD-10-CM

## 2015-10-13 NOTE — Therapy (Signed)
[place] MAIN [facility] SERVICES [address] [place], [place], [address] Phone: [phone]   Fax:  [phone]  Physical Therapy Treatment  Patient Details  Name: Tristan Clark MRN: [id number] Date of Birth: Mar 11, 1956 No Data Recorded  Encounter Date: 10/12/2015      PT End of Session - 10/12/15 1756    Visit Number 8   Number of Visits 17   Date for PT Re-Evaluation 11/23/15   PT Start Time 0530   PT Stop Time 0615   PT Time Calculation (min) 45 min   Activity Tolerance Patient tolerated treatment well      History reviewed. No pertinent past medical history.  Past Surgical History  Procedure Laterality Date  . Vasectomy  2001  . Colonoscopy  2009    Normal; unsure of MD    There were no vitals filed for this visit.  Visit Diagnosis:  Left shoulder pain      Subjective Assessment - 10/12/15 1754    Subjective Patient is having less pain today 1/10 and it is in his left scapula musculature.    Currently in Pain? No/denies   Pain Onset More than a month ago       Patient was instructed in HEP of scapula retraction and scapula depression, repeated SBR and rotation left x 20 E-stim to left scapula IFC with relief x 20 minutes and pain decreased to 0/10.  Patient is going to be DC to HEP.                           PT Education - 10/12/15 1755    Education provided Yes   Education Details positional    Person(s) Educated Patient   Methods Explanation   Comprehension Verbalized understanding             PT Long Term Goals - 09/28/15 1352    PT LONG TERM GOAL #1   Title  Patient will be able to perform household work/ chores without increase in symptoms 11/23/15   PT LONG TERM GOAL #2   Title Patient will report a worst pain of 3/10 on VAS in      left shoulder       to improve tolerance with ADLs and reduced symptoms with activities 11/23/15   PT LONG TERM GOAL #3   Title Patient will  improve AROM neck  so they are able to perform overhead ADL's such as reaching into cabinets, and working with objects overhead. 11/23/15               Plan - 10/12/15 1049    Clinical Impression Statement Patient is having less pain and is able to determine which positions irritate his neck and has relief with e-stim to left scapula muscle. Instructed in HEP and scpaula retractions.    Pt will benefit from skilled therapeutic intervention in order to improve on the following deficits Postural dysfunction;Impaired flexibility;Decreased range of motion;Pain   Rehab Potential Good   PT Frequency 2x / week   PT Duration 8 weeks   PT Treatment/Interventions Traction;Moist Heat;Electrical Stimulation;Cryotherapy;Ultrasound;Therapeutic activities;Therapeutic exercise;Manual techniques;Passive range of motion   PT Next Visit Plan thoracic manual therapy, chin tucks, progress HEP   Consulted and Agree with Plan of Care Patient        Problem List Patient Active Problem List   Diagnosis Date Noted  . Hyperlipidemia 08/10/2015  . Poor concentration 08/10/2015  . Heel  pain 08/10/2015  . Osteoarthrosis, hand 09/10/2009  . Pure hypercholesterolemia 09/10/2009  . Allergic rhinitis 03/05/2007  . Hearing loss 03/05/2007    Alanson Puls 10/13/2015, 11:01 AM  [place] MAIN [facility] SERVICES [address] [place], [place], [address] Phone: [phone]   Fax:  [phone]  Name: MANSA WILLERS MRN: [id number] Date of Birth: [birth year]

## 2015-10-15 ENCOUNTER — Ambulatory Visit: Payer: 59 | Admitting: Physical Therapy

## 2015-10-19 ENCOUNTER — Encounter: Payer: 59 | Admitting: Physical Therapy

## 2015-10-22 ENCOUNTER — Encounter: Payer: 59 | Admitting: Physical Therapy

## 2015-10-26 ENCOUNTER — Encounter: Payer: 59 | Admitting: Physical Therapy

## 2015-10-29 ENCOUNTER — Encounter: Payer: 59 | Admitting: Physical Therapy

## 2015-12-04 ENCOUNTER — Encounter: Payer: Self-pay | Admitting: Family Medicine

## 2015-12-04 ENCOUNTER — Ambulatory Visit (INDEPENDENT_AMBULATORY_CARE_PROVIDER_SITE_OTHER): Payer: 59 | Admitting: Family Medicine

## 2015-12-04 VITALS — BP 142/92 | HR 64 | Temp 97.7°F | Resp 14 | Ht 76.0 in | Wt 240.0 lb

## 2015-12-04 DIAGNOSIS — M25512 Pain in left shoulder: Secondary | ICD-10-CM | POA: Diagnosis not present

## 2015-12-04 NOTE — Patient Instructions (Signed)
Trigger Point Injection Trigger points are areas where you have muscle pain. A trigger point injection is a shot given in the trigger point to relieve that pain. A trigger point might feel like a knot in your muscle. It hurts to press on a trigger point. Sometimes the pain spreads out (radiates) to other parts of the body. For example, pressing on a trigger point in your shoulder might cause pain in your arm or neck. You might have one trigger point. Or, you might have more than one. People often have trigger points in their upper back and lower back. They also occur often in the neck and shoulders. Pain from a trigger point lasts for a long time. It can make it hard to keep moving. You might not be able to do the exercise or physical therapy that could help you deal with the pain. A trigger point injection may help. It does not work for everyone. But, it may relieve your pain for a few days or a few months. A trigger point injection does not cure long-lasting (chronic) pain. LET YOUR CAREGIVER KNOW ABOUT:  Any allergies (especially to latex, lidocaine, or steroids).  Blood-thinning medicines that you take. These drugs can lead to bleeding or bruising after an injection. They include:  Aspirin.  Ibuprofen.  Clopidogrel.  Warfarin.  Other medicines you take. This includes all vitamins, herbs, eyedrops, over-the-counter medicines, and creams.  Use of steroids.  Recent infections.  Past problems with numbing medicines.  Bleeding problems.  Surgeries you have had.  Other health problems. RISKS AND COMPLICATIONS A trigger point injection is a safe treatment. However, problems may develop, such as:  Minor side effects usually go away in 1 to 2 days. These may include:  Soreness.  Bruising.  Stiffness.  More serious problems are rare. But, they may include:  Bleeding under the skin (hematoma).  Skin infection.  Breaking off of the needle under your skin.  Lung  puncture.  The trigger point injection may not work for you. BEFORE THE PROCEDURE You may need to stop taking any medicine that thins your blood. This is to prevent bleeding and bruising. Usually these medicines are stopped several days before the injection. No other preparation is needed. PROCEDURE  A trigger point injection can be given in your caregiver's office or in a clinic. Each injection takes 2 minutes or less.  Your caregiver will feel for trigger points. The caregiver may use a marker to circle the area for the injection.  The skin over the trigger point will be washed with a germ-killing (antiseptic) solution.  The caregiver pinches the spot for the injection.  Then, a very thin needle is used for the shot. You may feel pain or a twitching feeling when the needle enters the trigger point.  A numbing solution may be injected into the trigger point. Sometimes a drug to keep down swelling, redness, and warmth (inflammation) is also injected.  Your caregiver moves the needle around the trigger zone until the tightness and twitching goes away.  After the injection, your caregiver may put gentle pressure over the injection site.  Then it is covered with a bandage. AFTER THE PROCEDURE  You can go right home after the injection.  The bandage can be taken off after a few hours.  You may feel sore and stiff for 1 to 2 days.  Go back to your regular activities slowly. Your caregiver may ask you to stretch your muscles. Do not do anything that takes   extra energy for a few days.  Follow your caregiver's instructions to manage and treat other pain.   This information is not intended to replace advice given to you by your health care provider. Make sure you discuss any questions you have with your health care provider.   Document Released: 11/24/2011 Document Revised: 04/01/2013 Document Reviewed: 11/24/2011 Elsevier Interactive Patient Education 2016 Elsevier Inc.  

## 2015-12-04 NOTE — Progress Notes (Signed)
Patient ID: Tristan Clark, male   DOB: 1956-10-11, 59 y.o.   MRN: [id number]   Patient: Tristan Clark Male    DOB: [birth year]   59 y.o.   MRN: [id number] Visit Date: 12/04/2015  Today's Provider: Vernie Murders, PA   Chief Complaint  Patient presents with  . Shoulder Pain   Subjective:    Shoulder Pain  The pain is present in the left shoulder, left arm and back. This is a recurrent problem. The current episode started more than 1 month ago. There has been no history of extremity trauma. The problem has been gradually worsening. The quality of the pain is described as aching and sharp. The pain is moderate. Associated symptoms comments: Pain in left medial scapula border and occasional numbness radiating down into the left arm.. The symptoms are aggravated by activity. He has tried NSAIDS for the symptoms. The treatment provided mild relief.   Patient Active Problem List   Diagnosis Date Noted  . Hyperlipidemia 08/10/2015  . Poor concentration 08/10/2015  . Heel pain 08/10/2015  . Osteoarthrosis, hand 09/10/2009  . Pure hypercholesterolemia 09/10/2009  . Allergic rhinitis 03/05/2007  . Hearing loss 03/05/2007   Past Surgical History  Procedure Laterality Date  . Vasectomy  2001  . Colonoscopy  2009    Normal; unsure of MD   Family History  Problem Relation Age of Onset  . Arthritis Sister   . Alcohol abuse Brother   . Cancer Maternal Grandmother    Allergies  Allergen Reactions  . Amoxicillin Rash     Previous Medications   ATOMOXETINE (STRATTERA) 80 MG CAPSULE    Take 1 capsule by mouth daily. According to starter pack   CYCLOBENZAPRINE (FLEXERIL) 5 MG TABLET    Take 1 tablet (5 mg total) by mouth 3 (three) times daily as needed for muscle spasms.   EPINEPHRINE (EPIPEN 2-PAK) 0.3 MG/0.3 ML IJ SOAJ INJECTION    Inject 1 Dose as directed as needed. For severe allergic reations   KRILL OIL 300 MG CAPS    Take 1 capsule by mouth daily.   LORATADINE (CLARITIN) 10 MG  TABLET    Take 1 tablet by mouth daily.   RED YEAST RICE 600 MG TABS    Take 1 tablet by mouth daily.   VALACYCLOVIR (VALTREX) 1000 MG TABLET    Take 1 tablet by mouth 2 (two) times daily as needed.    Review of Systems  Constitutional: Negative.   HENT: Negative.   Eyes: Negative.   Respiratory: Negative.   Cardiovascular: Negative.   Gastrointestinal: Negative.   Endocrine: Negative.   Genitourinary: Negative.   Musculoskeletal: Positive for myalgias.  Allergic/Immunologic: Negative.   Neurological: Negative.   Hematological: Negative.   Psychiatric/Behavioral: Negative.     Social History  Substance Use Topics  . Smoking status: Former Research scientist (life sciences)  . Smokeless tobacco: Not on file  . Alcohol Use: 0.0 oz/week    0 Standard drinks or equivalent per week     Comment: occasional use;  2-3 times a week drinks a Bourbon   Objective:   BP 142/92 mmHg  Pulse 64  Temp(Src) 97.7 F (36.5 C) (Oral)  Resp 14  Wt 240 lb (108.863 kg) Body mass index is 29.23 kg/(m^2).  BP Readings from Last 3 Encounters:  12/04/15 142/92  09/07/15 132/76  08/21/15 136/88    Physical Exam  Constitutional: He is oriented to person, place, and time. He appears well-developed.  HENT:  Head: Normocephalic.  Eyes:  Conjunctivae are normal.  Neck: Neck supple.  Cardiovascular: Normal rate.   Pulmonary/Chest: Effort normal.  Musculoskeletal: He exhibits tenderness.  Tender trigger point medial border of left scapula causes pain into superior trapezius and into the left arm with some numbness when pressure applied to that point. Good muscle strength both arms. Normal pulses.  Neurological: He is alert and oriented to person, place, and time. He has normal reflexes.  Skin: No rash noted.      Assessment & Plan:      1. Left shoulder pain Onset of intermittent pain in the left posterior shoulder at the medial border of the scapula over the past 2-3 months. 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 History of cervical arthritis on x-rays. May use OTC NSAID of choice and moist heat applications. No relief from physical therapy in the past. May need to consider cervical traction at home if no better after trigger point injection with Xylocaine 1% 1cc and DepoMedrol 80 mg 1cc.  2. Trigger point of shoulder region, left Immediate relief from injection at the left scapular border. Should limit strenuous lifting or overhead activities for the next 10 days. Recheck if needed in 2 weeks. - Inject trigger point, 1 or 2

## 2016-05-05 ENCOUNTER — Other Ambulatory Visit: Payer: Self-pay | Admitting: Family Medicine

## 2016-05-06 NOTE — Telephone Encounter (Signed)
Pt called for refill for valACYclovir (VALTREX) 1000 MG tablet  [facility].  Pt going out of town this afternoon.   Thanks Con Memos

## 2016-05-06 NOTE — Telephone Encounter (Signed)
Please review-aa 

## 2016-06-23 ENCOUNTER — Ambulatory Visit
Admission: RE | Admit: 2016-06-23 | Discharge: 2016-06-23 | Disposition: A | Discharge status: Home / Self Care | Payer: 59 | Source: Ambulatory Visit | Attending: Family Medicine | Admitting: Family Medicine

## 2016-06-23 ENCOUNTER — Ambulatory Visit (INDEPENDENT_AMBULATORY_CARE_PROVIDER_SITE_OTHER): Payer: 59 | Admitting: Family Medicine

## 2016-06-23 ENCOUNTER — Encounter: Payer: Self-pay | Admitting: Family Medicine

## 2016-06-23 VITALS — BP 122/70 | HR 68 | Temp 98.4°F | Resp 16 | Wt 231.0 lb

## 2016-06-23 DIAGNOSIS — M25512 Pain in left shoulder: Secondary | ICD-10-CM

## 2016-06-23 DIAGNOSIS — R202 Paresthesia of skin: Secondary | ICD-10-CM | POA: Diagnosis not present

## 2016-06-23 MED ORDER — LORAZEPAM 0.5 MG PO TABS: ORAL_TABLET | ORAL | Status: DC

## 2016-06-23 NOTE — Progress Notes (Signed)
Patient: Tristan Clark Male    DOB: Sep 23, 1956   60 y.o.   MRN: [id number] Visit Date: 06/23/2016  Today's Provider: Vernie Murders, PA   Chief Complaint  Patient presents with  . Back Pain   Subjective:    Back Pain This is a recurrent problem. The current episode started more than 1 month ago. The problem occurs intermittently. The problem is unchanged. The pain does not radiate. The pain is moderate. The pain is the same all the time. Stiffness is present all day. Associated symptoms include numbness and weakness.  Patient reports that he has had symptoms before in the past. He reports that he has tried physical therapy with no relief. Patient has not been taking anything OTC for symptoms. Patient feels that this may be a nerve issue.   Patient Active Problem List   Diagnosis Date Noted  . Hyperlipidemia 08/10/2015  . Poor concentration 08/10/2015  . Heel pain 08/10/2015  . Osteoarthrosis, hand 09/10/2009  . Pure hypercholesterolemia 09/10/2009  . Allergic rhinitis 03/05/2007  . Hearing loss 03/05/2007   Past Surgical History  Procedure Laterality Date  . Vasectomy  2001  . Colonoscopy  2009    Normal; unsure of MD   Family History  Problem Relation Age of Onset  . Arthritis Sister   . Alcohol abuse Brother   . Cancer Maternal Grandmother    Allergies  Allergen Reactions  . Amoxicillin Rash   Current Meds  Medication Sig  . EPINEPHrine (EPIPEN 2-PAK) 0.3 mg/0.3 mL IJ SOAJ injection Inject 1 Dose as directed as needed. For severe allergic reations  . Krill Oil 300 MG CAPS Take 1 capsule by mouth daily.  Marland Kitchen loratadine (CLARITIN) 10 MG tablet Take 1 tablet by mouth daily.  . Red Yeast Rice 600 MG TABS Take 1 tablet by mouth daily.  . valACYclovir (VALTREX) 1000 MG tablet TAKE 1 TABLET BY MOUTH TWICE A DAY AS NEEDED    Review of Systems  Musculoskeletal: Positive for myalgias, back pain and arthralgias.  Neurological: Positive for weakness and numbness.     Social History  Substance Use Topics  . Smoking status: Former Research scientist (life sciences)  . Smokeless tobacco: Not on file  . Alcohol Use: 0.0 oz/week    0 Standard drinks or equivalent per week     Comment: occasional use;  2-3 times a week drinks a Bourbon   Objective:   BP 122/70 mmHg  Pulse 68  Temp(Src) 98.4 F (36.9 C)  Resp 16  Wt 231 lb (104.781 kg)  Physical Exam  Constitutional: He is oriented to person, place, and time. He appears well-developed and well-nourished. No distress.  HENT:  Head: Normocephalic and atraumatic.  Right Ear: Hearing normal.  Left Ear: Hearing normal.  Nose: Nose normal.  Eyes: Conjunctivae and lids are normal. Right eye exhibits no discharge. Left eye exhibits no discharge. No scleral icterus.  Neck: Neck supple. No thyromegaly present.  Cardiovascular: Normal rate and regular rhythm.   Pulmonary/Chest: Effort normal and breath sounds normal. No respiratory distress.  Abdominal: Soft. Bowel sounds are normal.  Musculoskeletal: Normal range of motion.  Lymphadenopathy:    He has no cervical adenopathy.  Neurological: He is alert and oriented to person, place, and time.  Tingling in left posterior shoulder radiating down left arm to index and middle finger area. No grip strength weakness. Good DTR's bilaterally.  Skin: Skin is intact. No lesion and no rash noted.  Psychiatric: He has a  normal mood and affect. His speech is normal and behavior is normal. Thought content normal.      Assessment & Plan:     1. Paresthesias in left hand Recurrence over the past month with out specific injury. 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 Grip strength in the left arm is unchanged. Tingling radiating down the left arm to the index and middle finger. Some pain in the left posterior neck and shoulder. Use of NSAID's, muscle relaxant and physical therapy has not helped much. Will get MRI. May need referral to neurosurgeon. - MR Cervical Spine  Wo Contrast  2. Left shoulder pain Feels some of the left shoulder pain may not be related to neck. Will check left shoulder x-ray for degenerative changes. Good ROM with discomfort posteriorly and over [facility] joint. - DG Shoulder Left       Vernie Murders, PA  [facility]

## 2016-06-24 ENCOUNTER — Telehealth: Payer: Self-pay

## 2016-06-24 NOTE — Telephone Encounter (Signed)
-----   Message from Margo Common, [place] sent at 06/23/2016  1:50 PM EDT ----- Some mild arthritis in the [facility] joint of the left shoulder. Should not be causing tingling into the left hand. Proceed with MRI of C-spine. May continue with Ibuprofen 200 mg 3-4 tablets TID with food until scan report available.

## 2016-06-24 NOTE — Telephone Encounter (Signed)
Patient advised as below.  

## 2016-07-01 ENCOUNTER — Ambulatory Visit (INDEPENDENT_AMBULATORY_CARE_PROVIDER_SITE_OTHER): Payer: 59 | Admitting: Family Medicine

## 2016-07-01 ENCOUNTER — Encounter: Payer: Self-pay | Admitting: Family Medicine

## 2016-07-01 VITALS — BP 128/82 | HR 58 | Temp 98.5°F | Resp 16 | Wt 228.0 lb

## 2016-07-01 DIAGNOSIS — M5412 Radiculopathy, cervical region: Secondary | ICD-10-CM

## 2016-07-01 NOTE — Progress Notes (Signed)
Patient: Tristan Clark Male    DOB: December 09, 1956   60 y.o.   MRN: [id number] Visit Date: 07/01/2016  Today's Provider: Vernie Murders, PA   Chief Complaint  Patient presents with  . Discuss MRI results   Subjective:    HPI Patient comes in today to discuss recent MRI results from his left shoulder pain and numbness radiating down the left arm.    Patient Active Problem List   Diagnosis Date Noted  . Hyperlipidemia 08/10/2015  . Poor concentration 08/10/2015  . Heel pain 08/10/2015  . Osteoarthrosis, hand 09/10/2009  . Pure hypercholesterolemia 09/10/2009  . Allergic rhinitis 03/05/2007  . Hearing loss 03/05/2007   Past Surgical History  Procedure Laterality Date  . Vasectomy  2001  . Colonoscopy  2009    Normal; unsure of MD   Family History  Problem Relation Age of Onset  . Arthritis Sister   . Alcohol abuse Brother   . Cancer Maternal Grandmother    Allergies  Allergen Reactions  . Amoxicillin Rash   Current Meds  Medication Sig  . atomoxetine (STRATTERA) 80 MG capsule Take 1 capsule by mouth daily. Reported on 06/23/2016  . EPINEPHrine (EPIPEN 2-PAK) 0.3 mg/0.3 mL IJ SOAJ injection Inject 1 Dose as directed as needed. For severe allergic reations  . Krill Oil 300 MG CAPS Take 1 capsule by mouth daily.  Marland Kitchen loratadine (CLARITIN) 10 MG tablet Take 1 tablet by mouth daily.  Marland Kitchen LORazepam (ATIVAN) 0.5 MG tablet Take 1-2 tablets by mouth 2 hours prior to MRI.  . Red Yeast Rice 600 MG TABS Take 1 tablet by mouth daily.  . valACYclovir (VALTREX) 1000 MG tablet TAKE 1 TABLET BY MOUTH TWICE A DAY AS NEEDED    Review of Systems  Constitutional: Negative.   HENT: Negative.   Respiratory: Negative.   Cardiovascular: Negative.   Genitourinary: Negative.   Neurological:       Pain and numbness in the left arm radiating from the neck.    Social History  Substance Use Topics  . Smoking status: Former Research scientist (life sciences)  . Smokeless tobacco: Not on file  . Alcohol Use: 0.0  oz/week    0 Standard drinks or equivalent per week     Comment: occasional use;  2-3 times a week drinks a Bourbon   Objective:   BP 128/82 mmHg  Pulse 58  Temp(Src) 98.5 F (36.9 C)  Resp 16  Wt 228 lb (103.42 kg)  Physical Exam  Constitutional: He is oriented to person, place, and time. He appears well-developed and well-nourished. No distress.  HENT:  Head: Normocephalic and atraumatic.  Right Ear: Hearing normal.  Left Ear: Hearing normal.  Nose: Nose normal.  Eyes: Conjunctivae and lids are normal. Right eye exhibits no discharge. Left eye exhibits no discharge. No scleral icterus.  Neck:  Pain left posterior neck to rotate head to the left shoulder. No masses.    Pulmonary/Chest: Effort normal. No respiratory distress.  Musculoskeletal: He exhibits tenderness.  Tingling pain down the left shoulder to the C6-7 dermatomes. Good grip strength and normal pulses. Pain down left arm to look over the left shoulder.  Neurological: He is alert and oriented to person, place, and time. He has normal reflexes.  Skin: Skin is intact. No lesion and no rash noted.  Psychiatric: He has a normal mood and affect. His speech is normal and behavior is normal. Thought content normal.      Assessment & Plan:  1. Left cervical radiculopathy Slight help with discomfort with use of Ibuprofen 800 mg TID. Past recurrences have not responded well to physical therapy or prednisone. Seems to be progressive. No recent injury. MRI shows multilevel DDD and C6-7 disc bulge with severe left neural foramen narrowing. Will schedule neurosurgical referral. - Ambulatory referral to Neurosurgery       Vernie Murders, [facility]

## 2016-07-08 ENCOUNTER — Encounter: Payer: Self-pay | Admitting: Family Medicine

## 2016-07-29 ENCOUNTER — Encounter: Payer: Self-pay | Admitting: Family Medicine

## 2016-07-29 ENCOUNTER — Ambulatory Visit (INDEPENDENT_AMBULATORY_CARE_PROVIDER_SITE_OTHER): Payer: 59 | Admitting: Family Medicine

## 2016-07-29 ENCOUNTER — Other Ambulatory Visit: Payer: Self-pay | Admitting: Family Medicine

## 2016-07-29 VITALS — BP 112/76 | HR 60 | Temp 98.7°F | Resp 16 | Ht 76.0 in | Wt 230.0 lb

## 2016-07-29 DIAGNOSIS — R197 Diarrhea, unspecified: Secondary | ICD-10-CM

## 2016-07-29 DIAGNOSIS — R1010 Upper abdominal pain, unspecified: Secondary | ICD-10-CM

## 2016-07-29 DIAGNOSIS — R1011 Right upper quadrant pain: Secondary | ICD-10-CM

## 2016-07-29 LAB — POCT URINALYSIS DIPSTICK
BILIRUBIN UA: NEGATIVE
GLUCOSE UA: NEGATIVE
Ketones, UA: NEGATIVE
NITRITE UA: NEGATIVE
Protein, UA: NEGATIVE
Spec Grav, UA: >=1.03
UROBILINOGEN UA: 0.2
pH, UA: 5.5

## 2016-07-29 NOTE — Progress Notes (Signed)
Patient: Tristan Clark Male    DOB: 05-21-1956   60 y.o.   MRN: [id number] Visit Date: 07/29/2016  Today's Provider: Vernie Murders, PA   Chief Complaint  Patient presents with  . Abdominal Pain   Subjective:    Abdominal Pain  This is a new problem. The current episode started in the past 7 days. The onset quality is gradual. The problem occurs intermittently. The problem has been gradually improving. The pain is located in the RLQ. The pain is moderate. The quality of the pain is sharp. The abdominal pain does not radiate. Associated symptoms include diarrhea and flatus. Pertinent negatives include no nausea or vomiting. The pain is aggravated by eating. He has tried antacids for the symptoms.   Patient Active Problem List   Diagnosis Date Noted  . Hyperlipidemia 08/10/2015  . Poor concentration 08/10/2015  . Heel pain 08/10/2015  . Osteoarthrosis, hand 09/10/2009  . Pure hypercholesterolemia 09/10/2009  . Allergic rhinitis 03/05/2007  . Hearing loss 03/05/2007   Past Surgical History:  Procedure Laterality Date  . COLONOSCOPY  2009   Normal; unsure of MD  . VASECTOMY  2001   Family History  Problem Relation Age of Onset  . Arthritis Sister   . Alcohol abuse Brother   . Cancer Maternal Grandmother    Allergies  Allergen Reactions  . Amoxicillin Rash   Current Meds  Medication Sig  . EPINEPHrine (EPIPEN 2-PAK) 0.3 mg/0.3 mL IJ SOAJ injection Inject 1 Dose as directed as needed. For severe allergic reations  . Krill Oil 300 MG CAPS Take 1 capsule by mouth daily.  Marland Kitchen loratadine (CLARITIN) 10 MG tablet Take 1 tablet by mouth daily.  . Red Yeast Rice 600 MG TABS Take 1 tablet by mouth daily.  . valACYclovir (VALTREX) 1000 MG tablet TAKE 1 TABLET BY MOUTH TWICE A DAY AS NEEDED  . [DISCONTINUED] atomoxetine (STRATTERA) 80 MG capsule Take 1 capsule by mouth daily. Reported on 06/23/2016  . [DISCONTINUED] cyclobenzaprine (FLEXERIL) 5 MG tablet Take 1 tablet (5 mg  total) by mouth 3 (three) times daily as needed for muscle spasms.  . [DISCONTINUED] LORazepam (ATIVAN) 0.5 MG tablet Take 1-2 tablets by mouth 2 hours prior to MRI.    Review of Systems  Gastrointestinal: Positive for abdominal pain, diarrhea and flatus. Negative for nausea and vomiting.    Social History  Substance Use Topics  . Smoking status: Former Research scientist (life sciences)  . Smokeless tobacco: Never Used  . Alcohol use 0.0 oz/week     Comment: occasional use;  2-3 times a week drinks a Bourbon   Objective:   BP 112/76 (BP Location: Right Arm, Patient Position: Sitting, Cuff Size: Large)   Pulse 60   Temp 98.7 F (37.1 C) (Oral)   Resp 16   Ht 6\' 4"  (1.93 m)   Wt 230 lb (104.3 kg)   BMI 28.00 kg/m  Wt Readings from Last 3 Encounters:  07/29/16 230 lb (104.3 kg)  07/01/16 228 lb (103.4 kg)  06/23/16 231 lb (104.8 kg)    Physical Exam  Constitutional: He is oriented to person, place, and time. He appears well-developed and well-nourished. No distress.  HENT:  Head: Normocephalic and atraumatic.  Right Ear: Hearing normal.  Left Ear: Hearing normal.  Nose: Nose normal.  Eyes: Conjunctivae and lids are normal. Right eye exhibits no discharge. Left eye exhibits no discharge. No scleral icterus.  Cardiovascular: Normal rate and regular rhythm.   Pulmonary/Chest: Effort normal.  No respiratory distress.  Abdominal: Soft. Bowel sounds are normal. He exhibits no distension and no mass. There is no tenderness. There is no rebound and no guarding.  Musculoskeletal: Normal range of motion.  Neurological: He is alert and oriented to person, place, and time.  Skin: Skin is intact. No lesion and no rash noted.  Psychiatric: He has a normal mood and affect. His speech is normal and behavior is normal. Thought content normal.    Assessment & Plan:     1. RUQ discomfort Onset over the past week with some diarrhea and gas. Has been taking Ibuprofen 800 mg up to TID for left shoulder pains. No  nausea or vomiting but some dyspepsia. No dysuria but urinalysis showed moderate amount of non-hemolyzed blood. Will get urine C&S and encouraged to drink extra fluids. May use Zantac 150 mg BID for dyspepsia. Will check labs for possible liver or GI disease. Recheck pending reports. If any recurrence of pain (none today), will need CT scan to rule out renal stone. - CBC with Differential/Platelet - Comprehensive metabolic panel - POCT urinalysis dipstick - Urine culture  2. Diarrhea, unspecified type Intermittent loose stools with minimal cramping. No blood in stools. Will check labs and recommend bland diet. States he has a history of IBS with occasional diarrhea. - CBC with Differential/Platelet - Comprehensive metabolic panel       Vernie Murders, PA  [facility]

## 2016-07-30 LAB — CBC WITH DIFFERENTIAL/PLATELET
BASOS: 0 %
Basophils Absolute: 0 10*3/uL (ref 0.0–0.2)
EOS (ABSOLUTE): 0.3 10*3/uL (ref 0.0–0.4)
Eos: 4 %
Hematocrit: 40.9 % (ref 37.5–51.0)
Hemoglobin: 14.5 g/dL (ref 12.6–17.7)
IMMATURE GRANULOCYTES: 0 %
Immature Grans (Abs): 0 10*3/uL (ref 0.0–0.1)
LYMPHS ABS: 1.7 10*3/uL (ref 0.7–3.1)
Lymphs: 22 %
MCH: 30.2 pg (ref 26.6–33.0)
MCHC: 35.5 g/dL (ref 31.5–35.7)
MCV: 85 fL (ref 79–97)
MONOS ABS: 0.8 10*3/uL (ref 0.1–0.9)
Monocytes: 10 %
NEUTROS PCT: 64 %
Neutrophils Absolute: 4.9 10*3/uL (ref 1.4–7.0)
PLATELETS: 189 10*3/uL (ref 150–379)
RBC: 4.8 x10E6/uL (ref 4.14–5.80)
RDW: 12.8 % (ref 12.3–15.4)
WBC: 7.7 10*3/uL (ref 3.4–10.8)

## 2016-07-30 LAB — COMPREHENSIVE METABOLIC PANEL
A/G RATIO: 1.9 (ref 1.2–2.2)
ALK PHOS: 129 IU/L — AB (ref 39–117)
ALT: 33 IU/L (ref 0–44)
AST: 29 IU/L (ref 0–40)
Albumin: 4.3 g/dL (ref 3.5–5.5)
BILIRUBIN TOTAL: 0.7 mg/dL (ref 0.0–1.2)
BUN/Creatinine Ratio: 13 (ref 9–20)
BUN: 13 mg/dL (ref 6–24)
CALCIUM: 9.4 mg/dL (ref 8.7–10.2)
CHLORIDE: 103 mmol/L (ref 96–106)
CO2: 24 mmol/L (ref 18–29)
Creatinine, Ser: 0.98 mg/dL (ref 0.76–1.27)
GFR calc Af Amer: 97 mL/min/{1.73_m2} (ref >59)
GFR calc non Af Amer: 84 mL/min/{1.73_m2} (ref >59)
GLOBULIN, TOTAL: 2.3 g/dL (ref 1.5–4.5)
Glucose: 121 mg/dL — ABNORMAL HIGH (ref 65–99)
POTASSIUM: 4.6 mmol/L (ref 3.5–5.2)
SODIUM: 143 mmol/L (ref 134–144)
Total Protein: 6.6 g/dL (ref 6.0–8.5)

## 2016-07-31 LAB — URINE CULTURE: Organism ID, Bacteria: NO GROWTH

## 2016-08-01 ENCOUNTER — Telehealth: Payer: Self-pay | Admitting: Family Medicine

## 2016-08-01 ENCOUNTER — Telehealth: Payer: Self-pay

## 2016-08-01 DIAGNOSIS — R109 Unspecified abdominal pain: Secondary | ICD-10-CM

## 2016-08-01 NOTE — Telephone Encounter (Signed)
Pt calling in for lab results from Friday.  PCB# [phone]  Thanks, Con Memos

## 2016-08-01 NOTE — Telephone Encounter (Signed)
Patient has been advised he would like to have CT done since he is still having RUQ pain. KW

## 2016-08-01 NOTE — Telephone Encounter (Signed)
-----   Message from Margo Common, [place] sent at 08/01/2016 12:37 PM EDT ----- Labs showed only minor changes in blood sugar. If any further RUQ discomfort, will get CT scan to rule out renal stones.

## 2016-08-02 ENCOUNTER — Other Ambulatory Visit: Payer: Self-pay | Admitting: Family Medicine

## 2016-08-02 ENCOUNTER — Ambulatory Visit
Admission: RE | Admit: 2016-08-02 | Discharge: 2016-08-02 | Disposition: A | Discharge status: Home / Self Care | Payer: 59 | Source: Ambulatory Visit | Attending: Family Medicine | Admitting: Family Medicine

## 2016-08-02 DIAGNOSIS — R1011 Right upper quadrant pain: Secondary | ICD-10-CM | POA: Insufficient documentation

## 2016-08-02 DIAGNOSIS — N132 Hydronephrosis with renal and ureteral calculous obstruction: Secondary | ICD-10-CM | POA: Diagnosis not present

## 2016-08-02 DIAGNOSIS — R918 Other nonspecific abnormal finding of lung field: Secondary | ICD-10-CM

## 2016-08-02 DIAGNOSIS — K769 Liver disease, unspecified: Secondary | ICD-10-CM | POA: Diagnosis not present

## 2016-08-02 DIAGNOSIS — R109 Unspecified abdominal pain: Secondary | ICD-10-CM

## 2016-08-02 DIAGNOSIS — I700 Atherosclerosis of aorta: Secondary | ICD-10-CM | POA: Insufficient documentation

## 2016-08-02 MED ORDER — HYDROCODONE-ACETAMINOPHEN 5-325 MG PO TABS: 1.0000 | ORAL_TABLET | Freq: Four times a day (QID) | ORAL | 0 refills | Status: DC | PRN

## 2016-08-02 NOTE — Telephone Encounter (Signed)
Please advise.  Thanks,  -[employer]

## 2016-08-02 NOTE — Progress Notes (Signed)
Patient presents with acute flare of pain today. Will change CT scan order to stat for today.  CT scan showed a 5 mm stone in the proximal right ureter with mild hydronephrosis. Also, liver lesions with lung nodules suggestive of a possible carcinoma. Radiologist recommended a CXR to rule out a primary lesion in lungs. Will give Norco 5/325 mg 1 QID for flank pain and encouraged to drink extra fluids. May need referral to an urologist and oncologist pending CXR report tomorrow. (Discussed situation with Dr. Rosanna Randy to handle while I am away. Release order for CXR when patient comes by to get it done Wednesday 08-03-16).)

## 2016-08-02 NOTE — Telephone Encounter (Signed)
Pt is questioning if the Urine sample had any infection.  Please advise .  His call back is [phone]  Thanks, Con Memos

## 2016-08-02 NOTE — Telephone Encounter (Signed)
Continues to have intermittent pain in the RUQ/flank. Schedule CT scan to rule out renal stones.

## 2016-08-03 ENCOUNTER — Other Ambulatory Visit: Payer: Self-pay

## 2016-08-03 ENCOUNTER — Ambulatory Visit
Admission: RE | Admit: 2016-08-03 | Discharge: 2016-08-03 | Disposition: A | Discharge status: Home / Self Care | Payer: 59 | Source: Ambulatory Visit | Attending: Family Medicine | Admitting: Family Medicine

## 2016-08-03 DIAGNOSIS — K769 Liver disease, unspecified: Secondary | ICD-10-CM

## 2016-08-03 DIAGNOSIS — R918 Other nonspecific abnormal finding of lung field: Secondary | ICD-10-CM | POA: Diagnosis not present

## 2016-08-03 DIAGNOSIS — R9389 Abnormal findings on diagnostic imaging of other specified body structures: Secondary | ICD-10-CM

## 2016-08-03 DIAGNOSIS — N1330 Unspecified hydronephrosis: Secondary | ICD-10-CM

## 2016-08-04 ENCOUNTER — Telehealth: Payer: Self-pay | Admitting: Physician Assistant

## 2016-08-04 DIAGNOSIS — R918 Other nonspecific abnormal finding of lung field: Secondary | ICD-10-CM

## 2016-08-04 DIAGNOSIS — R160 Hepatomegaly, not elsewhere classified: Secondary | ICD-10-CM

## 2016-08-04 NOTE — Telephone Encounter (Signed)
Spoke with patient over the phone and gave xray results. Will proceed with PET scan for further evaluation to find cause of possible liver mets. Patient agrees with plan.

## 2016-08-04 NOTE — Telephone Encounter (Signed)
Patient is really wanting to hear his xray results from yesterday please.

## 2016-08-05 NOTE — Telephone Encounter (Signed)
Pt advised. States his GI appointment in [place] is a month away. Would you prefer to send him to [facility] to see if he can get in sooner? Renaldo Fiddler, CMA

## 2016-08-05 NOTE — Telephone Encounter (Signed)
Urine culture negative for infection

## 2016-08-05 NOTE — Telephone Encounter (Signed)
More immediate concern is getting this kidney stone with hydronephrosis seen by the urologist. May try to get him in sooner with [facility] if possible.

## 2016-08-05 NOTE — Telephone Encounter (Signed)
Can you try to get this pt to see [facility] sooner? Thanks. Renaldo Fiddler, CMA

## 2016-08-08 ENCOUNTER — Telehealth: Payer: Self-pay | Admitting: Physician Assistant

## 2016-08-08 DIAGNOSIS — N1330 Unspecified hydronephrosis: Secondary | ICD-10-CM

## 2016-08-08 DIAGNOSIS — R932 Abnormal findings on diagnostic imaging of liver and biliary tract: Secondary | ICD-10-CM

## 2016-08-08 DIAGNOSIS — N200 Calculus of kidney: Secondary | ICD-10-CM

## 2016-08-08 DIAGNOSIS — R160 Hepatomegaly, not elsewhere classified: Secondary | ICD-10-CM

## 2016-08-08 DIAGNOSIS — R918 Other nonspecific abnormal finding of lung field: Secondary | ICD-10-CM

## 2016-08-08 NOTE — Telephone Encounter (Signed)
Per Loma Sousa at [facility] pet scan should be initial staging skull base to thigh ID:[id number]

## 2016-08-08 NOTE — Telephone Encounter (Signed)
Jenni, I did not know you had already advised the patient of results. Are you putting in referral for urologist as well? I can if I need to. The message was also in Dr. Alben Spittle box, that's how I got in it sorry. Let me know what I can do to help. Thanks!   Notes Recorded by Margo Common, PA on 08/04/2016 at 2:31 PM EDT No pulmonary lesions or disease seen. Recommend evaluation by urologist to help with kidney stones causing hydronephrosis and gastroenterologist to evaluate liver lesions. (Placed order in chart - ask Dr. Rosanna Randy if he has any other suggestions.)

## 2016-08-08 NOTE — Telephone Encounter (Signed)
Order changed.

## 2016-08-08 NOTE — Telephone Encounter (Signed)
-----   Message from Jerrol Banana., MD sent at 08/08/2016 10:41 AM EDT ----- CXR normal.

## 2016-08-08 NOTE — Telephone Encounter (Signed)
Ok great.

## 2016-08-08 NOTE — Telephone Encounter (Signed)
I put in referral to urology just now [place]. I think it was sent to [facility], but then me since Simona Huh is out. I called patient directly to see what the plan had been between him and Simona Huh and patient also preferred the PET scan to rule out any malignancy as possible cause for liver masses.

## 2016-08-09 NOTE — Telephone Encounter (Signed)
Appointments at [facility] are further out than Dr Allen Norris

## 2016-08-09 NOTE — Telephone Encounter (Signed)
Advise patient about [facility] GI referral would be a longer wait than with Dr. Allen Norris.

## 2016-08-10 MED ORDER — TAMSULOSIN HCL 0.4 MG PO CAPS: 0.4000 mg | ORAL_CAPSULE | Freq: Every day | ORAL | 0 refills | Status: DC

## 2016-08-10 NOTE — Telephone Encounter (Signed)
Spoke with patient. No symptoms today. No fevers since last night. No abdominal pain or diarrhea. Mild gas (no more than normal). Occasional changes in urine flow. Has not seen stone yet. Will add flomax. Patient is to call if symptoms worsen.

## 2016-08-10 NOTE — Telephone Encounter (Signed)
Pt would like to speak with a nurse or Tawanna Sat b/c last night he was running a fever (didn't take temp but was really cold, shivering, shaking, & fatigued). Pt stated he  woke up at least 3 times sweating during the night. Pt stated he took Ibuprofen to lower temp. Pt stated he doesn't have a lot of pain due to the kidney stones but this concerned him and he wanted to speak with a nurse or Tawanna Sat. Thanks TNP

## 2016-08-11 ENCOUNTER — Telehealth: Payer: Self-pay | Admitting: Physician Assistant

## 2016-08-11 ENCOUNTER — Other Ambulatory Visit: Payer: Self-pay | Admitting: Physician Assistant

## 2016-08-11 DIAGNOSIS — N211 Calculus in urethra: Secondary | ICD-10-CM

## 2016-08-11 DIAGNOSIS — R509 Fever, unspecified: Secondary | ICD-10-CM

## 2016-08-11 NOTE — Telephone Encounter (Signed)
Patient states he has had several tick bites over the summer. Patient will come by the office around 2:30 today to get a lab slip and leave a urine specimen.

## 2016-08-11 NOTE — Telephone Encounter (Signed)
Make sure patient has not had any tick bites and we can check labs. I can print lab slip for him to get today and will f/u once I get results if he wishes. He can also give a urine specimen to make sure he is not getting a UTI from stone.

## 2016-08-11 NOTE — Telephone Encounter (Signed)
Pt called and stated he was advised to call back if he had any symptoms last night. Pt stated that the fever of 100.4 came on last night after dinner around 7ish and he took ibuprofen. Pt stated he woke up about 5 to 6 times during the night sweating and he was sweating so much the sheets were wet. Pt stated he has had little to no pain. Pt did take the Flomax that was sent to the pharmacy yesterday 08/10/16. Please advise. Thanks TNP

## 2016-08-11 NOTE — Telephone Encounter (Signed)
Perfect. Will await labs. Question RMSF as cause of fever.

## 2016-08-12 ENCOUNTER — Telehealth: Payer: Self-pay | Admitting: Family Medicine

## 2016-08-12 DIAGNOSIS — A770 Spotted fever due to Rickettsia rickettsii: Secondary | ICD-10-CM

## 2016-08-12 LAB — SPECIMEN STATUS REPORT

## 2016-08-12 LAB — URINALYSIS, MICROSCOPIC ONLY

## 2016-08-12 MED ORDER — DOXYCYCLINE HYCLATE 100 MG PO TABS
100.0000 mg | ORAL_TABLET | Freq: Two times a day (BID) | ORAL | 0 refills | Status: DC
Start: 2016-08-12 — End: 2016-08-17

## 2016-08-12 NOTE — Telephone Encounter (Signed)
Patient is continuing to have fevers over night and has a history of many tick bites this summer, stating "They have been real bad at my house." Will go ahead and start empiric treatment with doxycycline due to symptoms and history. I will be out of the office next week so I will inform Vernie Murders, PA-C to be on the lookout for the labs.

## 2016-08-12 NOTE — Telephone Encounter (Signed)
Pt called to request his lab results.  Pt is requesting a call back from [place].  CB#[phone]/MW

## 2016-08-15 ENCOUNTER — Ambulatory Visit
Admission: RE | Admit: 2016-08-15 | Discharge: 2016-08-15 | Disposition: A | Discharge status: Home / Self Care | Payer: 59 | Source: Ambulatory Visit | Attending: Physician Assistant | Admitting: Physician Assistant

## 2016-08-15 DIAGNOSIS — R932 Abnormal findings on diagnostic imaging of liver and biliary tract: Secondary | ICD-10-CM

## 2016-08-15 DIAGNOSIS — R160 Hepatomegaly, not elsewhere classified: Secondary | ICD-10-CM

## 2016-08-15 DIAGNOSIS — R918 Other nonspecific abnormal finding of lung field: Secondary | ICD-10-CM

## 2016-08-15 LAB — CBC WITH DIFFERENTIAL/PLATELET
BASOS: 0 %
Basophils Absolute: 0 10*3/uL (ref 0.0–0.2)
EOS (ABSOLUTE): 0.2 10*3/uL (ref 0.0–0.4)
EOS: 3 %
HEMATOCRIT: 38.1 % (ref 37.5–51.0)
HEMOGLOBIN: 13 g/dL (ref 12.6–17.7)
IMMATURE GRANS (ABS): 0 10*3/uL (ref 0.0–0.1)
Immature Granulocytes: 0 %
LYMPHS ABS: 1.5 10*3/uL (ref 0.7–3.1)
LYMPHS: 17 %
MCH: 29.4 pg (ref 26.6–33.0)
MCHC: 34.1 g/dL (ref 31.5–35.7)
MCV: 86 fL (ref 79–97)
MONOCYTES: 11 %
Monocytes Absolute: 1 10*3/uL — ABNORMAL HIGH (ref 0.1–0.9)
NEUTROS ABS: 6 10*3/uL (ref 1.4–7.0)
Neutrophils: 69 %
Platelets: 204 10*3/uL (ref 150–379)
RBC: 4.42 x10E6/uL (ref 4.14–5.80)
RDW: 13.4 % (ref 12.3–15.4)
WBC: 8.8 10*3/uL (ref 3.4–10.8)

## 2016-08-15 LAB — BASIC METABOLIC PANEL
BUN / CREAT RATIO: 14 (ref 9–20)
BUN: 13 mg/dL (ref 6–24)
CO2: 23 mmol/L (ref 18–29)
CREATININE: 0.9 mg/dL (ref 0.76–1.27)
Calcium: 9 mg/dL (ref 8.7–10.2)
Chloride: 102 mmol/L (ref 96–106)
GFR, EST AFRICAN AMERICAN: 108 mL/min/{1.73_m2} (ref >59)
GFR, EST NON AFRICAN AMERICAN: 93 mL/min/{1.73_m2} (ref >59)
GLUCOSE: 95 mg/dL (ref 65–99)
Potassium: 3.9 mmol/L (ref 3.5–5.2)
Sodium: 142 mmol/L (ref 134–144)

## 2016-08-15 LAB — B. BURGDORFI ANTIBODIES

## 2016-08-15 LAB — ROCKY MTN SPOTTED FVR AB, IGM-BLOOD: RMSF IgM: 0.23 index (ref 0.00–0.89)

## 2016-08-16 ENCOUNTER — Telehealth: Payer: Self-pay | Admitting: Family Medicine

## 2016-08-16 ENCOUNTER — Ambulatory Visit
Admission: RE | Admit: 2016-08-16 | Discharge: 2016-08-16 | Disposition: A | Discharge status: Home / Self Care | Payer: 59 | Source: Ambulatory Visit | Attending: Physician Assistant | Admitting: Physician Assistant

## 2016-08-16 DIAGNOSIS — R918 Other nonspecific abnormal finding of lung field: Secondary | ICD-10-CM | POA: Insufficient documentation

## 2016-08-16 DIAGNOSIS — K769 Liver disease, unspecified: Secondary | ICD-10-CM | POA: Insufficient documentation

## 2016-08-16 DIAGNOSIS — R160 Hepatomegaly, not elsewhere classified: Secondary | ICD-10-CM | POA: Diagnosis not present

## 2016-08-16 DIAGNOSIS — C259 Malignant neoplasm of pancreas, unspecified: Secondary | ICD-10-CM

## 2016-08-16 DIAGNOSIS — K869 Disease of pancreas, unspecified: Secondary | ICD-10-CM | POA: Diagnosis not present

## 2016-08-16 DIAGNOSIS — R932 Abnormal findings on diagnostic imaging of liver and biliary tract: Secondary | ICD-10-CM | POA: Diagnosis present

## 2016-08-16 DIAGNOSIS — C787 Secondary malignant neoplasm of liver and intrahepatic bile duct: Principal | ICD-10-CM

## 2016-08-16 LAB — GLUCOSE, CAPILLARY: Glucose-Capillary: 118 mg/dL — ABNORMAL HIGH (ref 65–99)

## 2016-08-16 MED ORDER — FLUDEOXYGLUCOSE F - 18 (FDG) INJECTION
12.0000 | Freq: Once | INTRAVENOUS | Status: AC | PRN
  Administered 2016-08-16: 12.72 via INTRAVENOUS

## 2016-08-16 NOTE — Telephone Encounter (Signed)
Pt called inquiring about the rest of his labs and the pet scan he had done this am.  Call back I [phone]  Thanks  Con Memos

## 2016-08-16 NOTE — Telephone Encounter (Signed)
Advised labs from 08-10-16 was negative for Lyme or RMSF disease. The PET scan done this morning showed multiple hypermetabolic lesions in the left and right hepatic lobes consistent with hepatic metastasis. Hypermetabolic lesions at the head of the pancreas is highly suspicious for pancreatic adenocarcinoma. Also, peripancreatic and periaortic metastatic lymph nodes were seen. Advised him the next step is to consider biopsy for confirmation and staging. Will schedule oncology referral.

## 2016-08-17 ENCOUNTER — Ambulatory Visit (INDEPENDENT_AMBULATORY_CARE_PROVIDER_SITE_OTHER): Payer: 59 | Admitting: Urology

## 2016-08-17 ENCOUNTER — Telehealth: Payer: Self-pay | Admitting: Internal Medicine

## 2016-08-17 ENCOUNTER — Other Ambulatory Visit: Payer: Self-pay | Admitting: Family Medicine

## 2016-08-17 ENCOUNTER — Encounter: Payer: Self-pay | Admitting: Urology

## 2016-08-17 ENCOUNTER — Other Ambulatory Visit: Payer: Self-pay | Admitting: Internal Medicine

## 2016-08-17 ENCOUNTER — Telehealth: Payer: Self-pay | Admitting: Urology

## 2016-08-17 VITALS — BP 154/108 | HR 80 | Ht 76.0 in | Wt 223.0 lb

## 2016-08-17 DIAGNOSIS — K869 Disease of pancreas, unspecified: Secondary | ICD-10-CM | POA: Diagnosis not present

## 2016-08-17 DIAGNOSIS — K8689 Other specified diseases of pancreas: Secondary | ICD-10-CM

## 2016-08-17 DIAGNOSIS — R160 Hepatomegaly, not elsewhere classified: Secondary | ICD-10-CM

## 2016-08-17 DIAGNOSIS — N201 Calculus of ureter: Secondary | ICD-10-CM | POA: Diagnosis not present

## 2016-08-17 LAB — URINALYSIS, COMPLETE
BILIRUBIN UA: NEGATIVE
GLUCOSE, UA: NEGATIVE
Ketones, UA: NEGATIVE
Leukocytes, UA: NEGATIVE
NITRITE UA: NEGATIVE
Protein, UA: NEGATIVE
RBC UA: NEGATIVE
Specific Gravity, UA: 1.015 (ref 1.005–1.030)
UUROB: 4 mg/dL — AB (ref 0.2–1.0)
pH, UA: 6 (ref 5.0–7.5)

## 2016-08-17 LAB — MICROSCOPIC EXAMINATION: Bacteria, UA: NONE SEEN

## 2016-08-17 NOTE — Telephone Encounter (Signed)
Spoke with radiology regarding ultrasound-guided liver biopsy. We'll speak to primary care regarding-ordering the biopsy. Spoke to [employer].

## 2016-08-17 NOTE — Progress Notes (Signed)
08/17/2016 4:40 PM   Tristan Clark 12-23-1955 [id number]  Referring provider: Margo Common, [place], [place] [address]  Chief Complaint  Patient presents with  . Hydronephrosis    New Patient    HPI: 60 year old patient referred for further evaluation of 5 mm right proximal ureteral calculus causing right hydronephrosis identified on CT scan on 08/02/2016.  He also has a 3 mm nonobstructing right lower pole calculus as well.  The CT scan was performed for further workup of several days of right-sided flank pain. Incidentally, he had multiple liver lesions and lung nodules which are appreciated.  He underwent further workup for these liver lesions in the form of PET scan yesterday which shows a lesion highly concerning for metastatic pancreatic cancer. He has a referral in place for oncology. Incidentally, the hydronephrosis on the right has resolved in the interim and the stone can be seen within the bladder at the right UVJ.  Last time he had flank pain was 4 days ago.  No urinary symptoms.  He is currently on Flomax.    He has had several urinalysis/urine cultures which have been negative to date other than for microscopic blood.    He denies a personal history of kidney stones.  He has recently had weight loss, night sweats, abdominal bloating and lack of appetite.     PMH: No past medical history on file.  Surgical History: Past Surgical History:  Procedure Laterality Date  . COLONOSCOPY  2009   Normal; unsure of MD  . VASECTOMY  2001    Home Medications:    Medication List       Accurate as of 08/17/16  4:40 PM. Always use your most recent med list.          EPIPEN 2-PAK 0.3 mg/0.3 mL Soaj injection Generic drug:  EPINEPHrine Inject 1 Dose as directed as needed. For severe allergic reations   HYDROcodone-acetaminophen 5-325 MG tablet Commonly known as:  NORCO Take 1 tablet by mouth every 6 (six) hours as needed for moderate  pain.   Krill Oil 300 MG Caps Take 1 capsule by mouth daily.   loratadine 10 MG tablet Commonly known as:  CLARITIN Take 1 tablet by mouth daily.   Red Yeast Rice 600 MG Tabs Take 1 tablet by mouth daily.   tamsulosin 0.4 MG Caps capsule Commonly known as:  FLOMAX Take 1 capsule (0.4 mg total) by mouth daily.   valACYclovir 1000 MG tablet Commonly known as:  VALTREX TAKE 1 TABLET BY MOUTH TWICE A DAY AS NEEDED       Allergies:  Allergies  Allergen Reactions  . Amoxicillin Rash    Family History: Family History  Problem Relation Age of Onset  . Arthritis Sister   . Nephrolithiasis Sister   . Alcohol abuse Brother   . Cancer Maternal Grandmother   . Kidney cancer Neg Hx   . Prostate cancer Neg Hx     Social History:  reports that he has quit smoking. He has never used smokeless tobacco. He reports that he drinks alcohol. He reports that he does not use drugs.  ROS: UROLOGY Frequent Urination?: Yes Hard to postpone urination?: No Burning/pain with urination?: No Get up at night to urinate?: Yes Leakage of urine?: No Urine stream starts and stops?: No Trouble starting stream?: Yes Do you have to strain to urinate?: No Blood in urine?: Yes Urinary tract infection?: No Sexually transmitted disease?: No Injury to kidneys or bladder?:  No Painful intercourse?: No Weak stream?: Yes Erection problems?: No Penile pain?: No  Gastrointestinal Nausea?: No Vomiting?: No Indigestion/heartburn?: No Diarrhea?: No Constipation?: Yes  Constitutional Fever: Yes Night sweats?: Yes Weight loss?: Yes Fatigue?: Yes  Skin Skin rash/lesions?: No Itching?: Yes  Eyes Blurred vision?: No Double vision?: No  Ears/Nose/Throat Sore throat?: No Sinus problems?: No  Hematologic/Lymphatic Swollen glands?: No Easy bruising?: No  Cardiovascular Leg swelling?: Yes Chest pain?: No  Respiratory Cough?: No Shortness of breath?: No  Endocrine Excessive thirst?:  No  Musculoskeletal Back pain?: No Joint pain?: No  Neurological Headaches?: No Dizziness?: No  Psychologic Depression?: No Anxiety?: No  Physical Exam: BP (!) 154/108   Pulse 80   Ht 6\' 4"  (1.93 m)   Wt 223 lb (101.2 kg)   BMI 27.14 kg/m   Constitutional:  Alert and oriented, No acute distress. HEENT: [place] AT, moist mucus membranes.  Trachea midline, no masses. Cardiovascular: No clubbing, cyanosis, or edema. Respiratory: Normal respiratory effort, no increased work of breathing. GI: Abdomen is soft, nontender, nondistended, no abdominal masses GU: No CVA tenderness.  Skin: No rashes, bruises or suspicious lesions. Lymph: No cervical adenopathy. Neurologic: Grossly intact, no focal deficits, moving all 4 extremities. Psychiatric: Normal mood and affect.  Laboratory Data: Lab Results  Component Value Date   WBC 8.8 08/11/2016   HGB 16.0 01/13/2015   HCT 38.1 08/11/2016   MCV 86 08/11/2016   PLT 204 08/11/2016    Lab Results  Component Value Date   CREATININE 0.90 08/11/2016    Urinalysis UA reviewed, negative today (see EPIC)  Pertinent Imaging: IMPRESSION: 1. Multiple low-attenuation liver lesions of varying size is worrisome for either metastatic involvement of the liver poor multicentric hepatocellular carcinoma. Chest x-ray is recommended, and PET-CT may be helpful to assess for primary malignancy. 2. Multiple noncalcified lung nodules through the lung bases none larger than 4 mm in diameter. General followup recommendations are given above, but in view of the liver lesions described above, chest x-ray is recommended to exclude primary malignancy. 3. 5 mm proximal right ureteral calculus causing mild right hydronephrosis. 3 mm nonobstructing right lower pole renal calculus. 4. Mild atherosclerotic change of the abdominal aorta.   Electronically Signed   By: Ivar Drape M.D.   On: 08/02/2016 11:15  IMPRESSION: 1. Multiple hypermetabolic lesions  within LEFT and RIGHT hepatic lobe consistent with hepatic metastasis. 2. High suspicion for PANCREATIC ADENOCARCINOMA with hypermetabolic lesion at the head of the pancreas. Difficult define a true lesion on noncontrast CT. No duct dilatation. Consider hepatic biopsy to confirm versus endoscopic ultrasound. 3. Peripancreatic and periaortic hypermetabolic metastatic lymph nodes. 4. No evidence of colorectal primary carcinoma. 5. Small nodules of the lung bases without metabolic activity. These nodules are below accurate PET characterization. Recommend attention on follow-up.   Electronically Signed   By: Suzy Bouchard M.D.   On: 08/16/2016 13:11  Both noncontrast CT scan and CT PET were reviewed today personally.  Assessment & Plan:   1. Ureteral calculus 5 mm right ureteral stone now at the level of the right UVJ with no further hydronephrosis. I anticipate based on the size and location of the stone, he will be able to pass this with medical expulsive therapy. Encourage continuation of Flomax. He was given a urinary strainer today.  He will call our office if he has not passed stone within the week.  Warning signs were reviewed today in detail including urinary symptoms, fevers, chills, or any other concerning  symptoms.  We did discuss stone prevention techniques in detail today including stone diet recommendations. - Urinalysis, Complete  2. Pancreatic mass More concerning today is the findings on CT PET scan which showed a pancreatic mass with metastatic disease to the liver and lungs.  This is likely the cause of his abdominal bloating, night sweats, and fatigue.   I contacted the cancer center as well as Roderic Ovens, GU/ GI oncology navigator to help facilitate getting him seen as soon as possible. Findings were reviewed today with the patient. Questions were answered.   Return for as needed if stone fails to pass sponatenously .  Hollice Espy,  MD  [facility] [address], [place], [place] [address] [phone]  I spent 45 min with this patient of which greater than 50% was spent in counseling and coordination of care with the patient.

## 2016-08-17 NOTE — Telephone Encounter (Signed)
Note opened in error.

## 2016-08-17 NOTE — Progress Notes (Signed)
Patient requires ultrasound guided liver biopsy for evaluation of liver masses. I can't tell what orders need to be placed. Can you please schedule and see what orders need to be entered. Thanks.

## 2016-08-18 ENCOUNTER — Other Ambulatory Visit: Payer: Self-pay | Admitting: Family Medicine

## 2016-08-18 DIAGNOSIS — R918 Other nonspecific abnormal finding of lung field: Secondary | ICD-10-CM

## 2016-08-18 DIAGNOSIS — R160 Hepatomegaly, not elsewhere classified: Secondary | ICD-10-CM

## 2016-08-18 MED ORDER — HYDROCODONE-ACETAMINOPHEN 5-325 MG PO TABS: 1.0000 | ORAL_TABLET | Freq: Four times a day (QID) | ORAL | 0 refills | Status: DC | PRN

## 2016-08-18 NOTE — Telephone Encounter (Signed)
Order [id number] [id number] entered in epic.

## 2016-08-18 NOTE — Telephone Encounter (Signed)
Will refill Norco. Prescription has to be hand signed. Will leave it at the front desk for pick up.

## 2016-08-18 NOTE — Telephone Encounter (Signed)
Pt called wanting a refill on his hydrocodone.   Please call when ready for pick up  Call back is [phone]  Thanks Con Memos

## 2016-08-19 NOTE — Telephone Encounter (Signed)
Patient advised RX is ready for pick up.  

## 2016-08-23 ENCOUNTER — Telehealth: Payer: Self-pay

## 2016-08-23 NOTE — Telephone Encounter (Signed)
  Oncology Nurse Navigator Documentation  Navigator Location: [facility] (08/23/16 1600) Navigator Encounter Type: Telephone (08/23/16 1600) Telephone: Appt Confirmation/Clarification;Outgoing Call (08/23/16 1600)                                        Time Spent with Patient: 15 (08/23/16 1600)   Called and notified Tristan Clark of appt with Dr Rogue Bussing 09-01-16 at [place] at [facility]. Will make every effort to get biopsy completed before 9/11. Voicemail left with specials scheduling to return call regarding.

## 2016-08-25 ENCOUNTER — Telehealth: Payer: Self-pay | Admitting: Family Medicine

## 2016-08-25 NOTE — Telephone Encounter (Signed)
Pt is requesting a call back from D

## 2016-08-25 NOTE — Telephone Encounter (Signed)
Pt is requesting a call back from [place].  CB#[phone]/MW

## 2016-08-26 NOTE — Telephone Encounter (Signed)
Patient wanted to let us know he passed the kidney stone and has an appointment for liver needle biopsy by radiologist planned for Monday 08-29-16.

## 2016-08-29 ENCOUNTER — Ambulatory Visit
Admission: RE | Admit: 2016-08-29 | Discharge: 2016-08-29 | Disposition: A | Discharge status: Home / Self Care | Payer: 59 | Source: Ambulatory Visit | Attending: Family Medicine | Admitting: Family Medicine

## 2016-08-29 DIAGNOSIS — Z87891 Personal history of nicotine dependence: Secondary | ICD-10-CM | POA: Diagnosis not present

## 2016-08-29 DIAGNOSIS — Z880 Allergy status to penicillin: Secondary | ICD-10-CM | POA: Insufficient documentation

## 2016-08-29 DIAGNOSIS — R918 Other nonspecific abnormal finding of lung field: Secondary | ICD-10-CM | POA: Diagnosis present

## 2016-08-29 DIAGNOSIS — Z79899 Other long term (current) drug therapy: Secondary | ICD-10-CM | POA: Diagnosis not present

## 2016-08-29 DIAGNOSIS — C787 Secondary malignant neoplasm of liver and intrahepatic bile duct: Secondary | ICD-10-CM | POA: Diagnosis not present

## 2016-08-29 DIAGNOSIS — R160 Hepatomegaly, not elsewhere classified: Secondary | ICD-10-CM

## 2016-08-29 HISTORY — DX: Malignant (primary) neoplasm, unspecified: C80.1

## 2016-08-29 HISTORY — DX: Unspecified osteoarthritis, unspecified site: M19.90

## 2016-08-29 LAB — CBC
HCT: 36.7 % — ABNORMAL LOW (ref 40.0–52.0)
Hemoglobin: 12.9 g/dL — ABNORMAL LOW (ref 13.0–18.0)
MCH: 29.4 pg (ref 26.0–34.0)
MCHC: 35.3 g/dL (ref 32.0–36.0)
MCV: 83.3 fL (ref 80.0–100.0)
PLATELETS: 185 10*3/uL (ref 150–440)
RBC: 4.4 MIL/uL (ref 4.40–5.90)
RDW: 12.9 % (ref 11.5–14.5)
WBC: 11.4 10*3/uL — AB (ref 3.8–10.6)

## 2016-08-29 LAB — PROTIME-INR
INR: 1.25
Prothrombin Time: 15.8 seconds — ABNORMAL HIGH (ref 11.4–15.2)

## 2016-08-29 LAB — APTT: APTT: 31 s (ref 24–36)

## 2016-08-29 MED ORDER — FENTANYL CITRATE (PF) 100 MCG/2ML IJ SOLN
INTRAMUSCULAR | Status: AC | PRN
  Administered 2016-08-29: 25 ug via INTRAVENOUS
  Administered 2016-08-29: 50 ug via INTRAVENOUS

## 2016-08-29 MED ORDER — MIDAZOLAM HCL 5 MG/5ML IJ SOLN
INTRAMUSCULAR | Status: AC | PRN
  Administered 2016-08-29: 0.5 mg via INTRAVENOUS
  Administered 2016-08-29 (×2): 1 mg via INTRAVENOUS

## 2016-08-29 MED ORDER — SODIUM CHLORIDE 0.9 % IV SOLN
INTRAVENOUS | Status: DC
  Administered 2016-08-29: 09:00:00 via INTRAVENOUS

## 2016-08-29 NOTE — Procedures (Signed)
Interventional Radiology Procedure Note  Procedure: US guided biopsy of right liver mass, seg6.  4 x 18g core.    Complications: None Recommendations:  - Ok to shower tomorrow - 2 hours recovery.  - Follow up pathology  Signed,  Dulcy Fanny. Earleen Newport, DO

## 2016-08-29 NOTE — Consult Note (Signed)
Chief Complaint: Liver mass  Referring Physician(s): Birdie Sons  Supervising Physician: Corrie Mckusick  Patient Status: Outpatient  History of Present Illness: Tristan Clark is a 60 y.o. male presenting for targeted liver lesion biopsy.   Past Medical History:  Diagnosis Date  . Arthritis   . Cancer [facility])    skin    Past Surgical History:  Procedure Laterality Date  . COLONOSCOPY  2009   Normal; unsure of MD  . VASECTOMY  2001    Allergies: Amoxicillin  Medications: Prior to Admission medications   Medication Sig Start Date End Date Taking? Authorizing Provider  HYDROcodone-acetaminophen (NORCO) 5-325 MG tablet Take 1 tablet by mouth every 6 (six) hours as needed for moderate pain. 08/18/16  Yes Dennis E Chrismon, PA  ibuprofen (ADVIL,MOTRIN) 200 MG tablet Take 200 mg by mouth every 6 (six) hours as needed.   Yes Historical Provider, MD  valACYclovir (VALTREX) 1000 MG tablet TAKE 1 TABLET BY MOUTH TWICE A DAY AS NEEDED 05/06/16  Yes Dennis E Chrismon, PA  EPINEPHrine (EPIPEN 2-PAK) 0.3 mg/0.3 mL IJ SOAJ injection Inject 1 Dose as directed as needed. For severe allergic reations 12/09/14   Historical Provider, MD  Javier Docker Oil 300 MG CAPS Take 1 capsule by mouth daily.    Historical Provider, MD  loratadine (CLARITIN) 10 MG tablet Take 1 tablet by mouth daily.    Historical Provider, MD  Red Yeast Rice 600 MG TABS Take 1 tablet by mouth daily.    Historical Provider, MD  tamsulosin (FLOMAX) 0.4 MG CAPS capsule Take 1 capsule (0.4 mg total) by mouth daily. 08/10/16   Mar Daring, PA-C     Family History  Problem Relation Age of Onset  . Arthritis Sister   . Nephrolithiasis Sister   . Alcohol abuse Brother   . Cancer Maternal Grandmother   . Kidney cancer Neg Hx   . Prostate cancer Neg Hx     Social History   Social History  . Marital status: Married    Spouse name: N/A  . Number of children: 2  . Years of education: N/A   Occupational History    . [employer]    Social History Main Topics  . Smoking status: Former Smoker    Quit date: 08/29/1993  . Smokeless tobacco: Never Used  . Alcohol use 0.0 oz/week     Comment: occasional use;  2-3 times a week drinks a Bourbon  . Drug use: No  . Sexual activity: Not Asked   Other Topics Concern  . None   Social History Narrative  . None    ECOG Status: 0 - Asymptomatic  Review of Systems: A 12 point ROS discussed and pertinent positives are indicated in the HPI above.  All other systems are negative.  Review of Systems  Vital Signs: BP (!) 132/93   Pulse 78   Temp 98.3 F (36.8 C) (Oral)   Resp 18   SpO2 96%   Physical Exam  Mallampati Score:     Imaging: Ct Abdomen Pelvis Wo Contrast  Result Date: 08/02/2016 CLINICAL DATA:  Right flank pain for 5 days, micro hematuria, no history of kidney stone EXAM: CT ABDOMEN AND PELVIS WITHOUT CONTRAST TECHNIQUE: Multidetector CT imaging of the abdomen and pelvis was performed following the standard protocol without IV contrast. COMPARISON:  None. FINDINGS: On lung window images, there are a few lung nodules which are noncalcified bilaterally. A 3 mm nodule is present in the posterior left lower  lobe on image 18 series 4. A questionable 4 mm nodule is noted anteriorly in the left lower lobe on image 13. Also, a triangular 4 mm nodule is present pleural-based in the right lower lobe on image 12 No follow-up needed if patient is low-risk (and has no known or suspected primary neoplasm). Non-contrast chest CT can be considered in 12 months if patient is high-risk. This recommendation follows the consensus statement: Guidelines for Management of Incidental Pulmonary Nodules Detected on CT Images:From the Fleischner Society 2017; published online before print (10.1148/radiol.SG:5268862). There are multiple poorly defined low-attenuation structures of varying sizes scattered throughout the liver very suspicious for metastatic involvement of the  liver. One of the larger lesions is in the caudal right lobe of liver measuring 4.1 cm in diameter on image 35. No ductal dilatation is seen. No calcified gallstones are noted. The pancreas is normal in size and the pancreatic duct is not dilated. The adrenal glands and spleen are unremarkable. The stomach is not well distended. A lymph node posterior to the pancreatic head on image 29 measures 11 mm in short axis diameter. There does appear to be mild right hydronephrosis present. A 3 mm nonobstructing calculus is noted in the lower pole of the right kidney. No left renal calculi are seen. However, the right ureter is slightly prominent to point of low-grade obstruction caused by a 5 mm proximal right ureteral calculus. The proximal left ureter is normal in caliber. Mild atherosclerotic change of the abdominal aorta is noted. Only small retroperitoneal and mesenteric lymph nodes are present. The distal ureters are normal in caliber and no distal ureteral calculus is seen. The urinary bladder is not well distended and therefore is somewhat difficult to assess. The prostate is somewhat prominent measuring 4.5 x 4.9 cm. No definite colonic lesion is seen although portions of the colon are decompressed. The terminal ileum and the appendix are unremarkable The lumbar vertebrae are in normal alignment with degenerative disc disease at L5-S1. No lytic or blastic bone lesion is seen. IMPRESSION: 1. Multiple low-attenuation liver lesions of varying size is worrisome for either metastatic involvement of the liver poor multicentric hepatocellular carcinoma. Chest x-ray is recommended, and PET-CT may be helpful to assess for primary malignancy. 2. Multiple noncalcified lung nodules through the lung bases none larger than 4 mm in diameter. General followup recommendations are given above, but in view of the liver lesions described above, chest x-ray is recommended to exclude primary malignancy. 3. 5 mm proximal right ureteral  calculus causing mild right hydronephrosis. 3 mm nonobstructing right lower pole renal calculus. 4. Mild atherosclerotic change of the abdominal aorta. Electronically Signed   By: Ivar Drape M.D.   On: 08/02/2016 11:15   Dg Chest 2 View  Result Date: 08/03/2016 CLINICAL DATA:  Incidental pulmonary and liver lesions on abdominal CT yesterday. No chest complaints or cardiac history EXAM: CHEST  2 VIEW COMPARISON:  Abdominal and pelvic CT scan of August 02, 2016 and chest x-ray of September 27, 2011 FINDINGS: The lungs are adequately inflated. There is no focal infiltrate. No pulmonary nodules are observed. The heart and pulmonary vascularity are normal. The mediastinum is normal in width. There is mild tortuosity of the descending thoracic aorta. There is no pleural effusion. The bony thorax exhibits no acute abnormality. There is gentle dextrocurvature centered at approximately T8. IMPRESSION: No active cardiopulmonary disease is observed. The 3-4 mm lower lobe lung nodules demonstrated on yesterday's CT scan are not visible on today's study. Electronically  Signed   By: David  Martinique M.D.   On: 08/03/2016 09:04   Nm Pet Image Initial (pi) Skull Base To Thigh  Result Date: 08/16/2016 CLINICAL DATA:  Initial treatment strategy for hepatic lesion. EXAM: NUCLEAR MEDICINE PET SKULL BASE TO THIGH TECHNIQUE: 12.7 mCi F-18 FDG was injected intravenously. Full-ring PET imaging was performed from the skull base to thigh after the radiotracer. CT data was obtained and used for attenuation correction and anatomic localization. FASTING BLOOD GLUCOSE:  Value: 118 mg/dl COMPARISON:  CT without contrast 08/02/2016 FINDINGS: NECK No hypermetabolic lymph nodes in the neck. CHEST The are 2 small nodules at the lung bases with no associated metabolic activity measuring 3 mm in RIGHT lower lobe on image 132 and 3 mm LEFT lower lobe on image 139. This focus of hypermetabolic activity at the LEFT lung base just above the diaphragm  SUV max equal 2.2. No clear parenchymal lung lesion identified. This may represent misregistration with hepatic metastasis. ABDOMEN/PELVIS There are multiple foci of intense metabolic activity associated with rounded hypodense lesions within the LEFT a nd RIGHT hepatic lobe. For example lesion in the caudate lobe SUV max equal 8.4. Hypermetabolic lesion in the anterior LEFT hepatic lobe with SUV max equal 10.9. There approximately a 12 lesions in the liver of varying sizes. There is a hypermetabolic focus within the pancreatic head with SUV max equals 7.3. There is a rounded fullness measuring 2.7 in this the location but difficult to define on noncontrast CT. There is a hypermetabolic peripancreatic lymph node dorsal to the pancreas measuring 11 mm with SUV max equal 8.4. The hypermetabolic periaortic lymph node between the IVC and the aorta on image 187 fused data set. No abnormal metabolic activity associated:  . SKELETON No focal hypermetabolic activity to suggest skeletal metastasis. IMPRESSION: 1. Multiple hypermetabolic lesions within LEFT and RIGHT hepatic lobe consistent with hepatic metastasis. 2. High suspicion for PANCREATIC ADENOCARCINOMA with hypermetabolic lesion at the head of the pancreas. Difficult define a true lesion on noncontrast CT. No duct dilatation. Consider hepatic biopsy to confirm versus endoscopic ultrasound. 3. Peripancreatic and periaortic hypermetabolic metastatic lymph nodes. 4. No evidence of colorectal primary carcinoma. 5. Small nodules of the lung bases without metabolic activity. These nodules are below accurate PET characterization. Recommend attention on follow-up. Electronically Signed   By: Suzy Bouchard M.D.   On: 08/16/2016 13:11    Labs:  CBC:  Recent Labs  07/29/16 0931 08/11/16 1429 08/29/16 0756  WBC 7.7 8.8 11.4*  HGB  --   --  12.9*  HCT 40.9 38.1 36.7*  PLT 189 204 185    COAGS:  Recent Labs  08/29/16 0756  INR 1.25  APTT 31     BMP:  Recent Labs  07/29/16 0931 08/11/16 1429  NA 143 142  K 4.6 3.9  CL 103 102  CO2 24 23  GLUCOSE 121* 95  BUN 13 13  CALCIUM 9.4 9.0  CREATININE 0.98 0.90  GFRNONAA 84 93  GFRAA 97 108    LIVER FUNCTION TESTS:  Recent Labs  07/29/16 0931  BILITOT 0.7  AST 29  ALT 33  ALKPHOS 129*  PROT 6.6  ALBUMIN 4.3    TUMOR MARKERS: No results for input(s): AFPTM, CEA, CA199, CHROMGRNA in the last 8760 hours.  Assessment and Plan:  60 yo male presents for liver lesion biopsy.   Risks and Benefits discussed with the patient including, but not limited to bleeding, infection, damage to adjacent structures or low yield  requiring additional tests. All of the patient's questions were answered, patient is agreeable to proceed. Consent signed and in chart.    Thank you for this interesting consult.  I greatly enjoyed meeting SHAHZAIB PRICHARD and look forward to participating in their care.  A copy of this report was sent to the requesting provider on this date.  Electronically Signed: Corrie Mckusick 08/29/2016, 8:38 AM   I spent a total of  15 Minutes   in face to face in clinical consultation, greater than 50% of which was counseling/coordinating care for targeted liver lesion biopsy.

## 2016-08-30 ENCOUNTER — Encounter: Payer: Self-pay | Admitting: Family Medicine

## 2016-08-30 LAB — SURGICAL PATHOLOGY

## 2016-09-01 ENCOUNTER — Other Ambulatory Visit: Payer: Self-pay | Admitting: Vascular Surgery

## 2016-09-01 ENCOUNTER — Inpatient Hospital Stay: Payer: 59 | Attending: Internal Medicine | Admitting: Internal Medicine

## 2016-09-01 ENCOUNTER — Encounter: Payer: Self-pay | Admitting: Internal Medicine

## 2016-09-01 ENCOUNTER — Other Ambulatory Visit: Payer: Self-pay

## 2016-09-01 ENCOUNTER — Ambulatory Visit: Payer: 59

## 2016-09-01 ENCOUNTER — Ambulatory Visit
Admission: RE | Admit: 2016-09-01 | Discharge: 2016-09-01 | Disposition: A | Discharge status: Home / Self Care | Payer: 59 | Source: Ambulatory Visit | Attending: Internal Medicine | Admitting: Internal Medicine

## 2016-09-01 ENCOUNTER — Inpatient Hospital Stay: Payer: 59

## 2016-09-01 ENCOUNTER — Encounter (INDEPENDENT_AMBULATORY_CARE_PROVIDER_SITE_OTHER): Payer: Self-pay

## 2016-09-01 VITALS — BP 130/86 | HR 98 | Temp 99.2°F | Resp 17

## 2016-09-01 DIAGNOSIS — K5900 Constipation, unspecified: Secondary | ICD-10-CM | POA: Diagnosis not present

## 2016-09-01 DIAGNOSIS — Z7901 Long term (current) use of anticoagulants: Secondary | ICD-10-CM | POA: Insufficient documentation

## 2016-09-01 DIAGNOSIS — C250 Malignant neoplasm of head of pancreas: Secondary | ICD-10-CM

## 2016-09-01 DIAGNOSIS — M1990 Unspecified osteoarthritis, unspecified site: Secondary | ICD-10-CM | POA: Diagnosis not present

## 2016-09-01 DIAGNOSIS — R630 Anorexia: Secondary | ICD-10-CM | POA: Insufficient documentation

## 2016-09-01 DIAGNOSIS — M7989 Other specified soft tissue disorders: Secondary | ICD-10-CM | POA: Diagnosis present

## 2016-09-01 DIAGNOSIS — R600 Localized edema: Secondary | ICD-10-CM | POA: Insufficient documentation

## 2016-09-01 DIAGNOSIS — R1011 Right upper quadrant pain: Secondary | ICD-10-CM | POA: Insufficient documentation

## 2016-09-01 DIAGNOSIS — I824Z1 Acute embolism and thrombosis of unspecified deep veins of right distal lower extremity: Secondary | ICD-10-CM | POA: Diagnosis not present

## 2016-09-01 DIAGNOSIS — R918 Other nonspecific abnormal finding of lung field: Secondary | ICD-10-CM | POA: Diagnosis not present

## 2016-09-01 DIAGNOSIS — C787 Secondary malignant neoplasm of liver and intrahepatic bile duct: Secondary | ICD-10-CM

## 2016-09-01 DIAGNOSIS — Z87891 Personal history of nicotine dependence: Secondary | ICD-10-CM | POA: Diagnosis not present

## 2016-09-01 DIAGNOSIS — R634 Abnormal weight loss: Secondary | ICD-10-CM | POA: Diagnosis not present

## 2016-09-01 DIAGNOSIS — M79661 Pain in right lower leg: Secondary | ICD-10-CM | POA: Insufficient documentation

## 2016-09-01 DIAGNOSIS — R112 Nausea with vomiting, unspecified: Secondary | ICD-10-CM | POA: Insufficient documentation

## 2016-09-01 DIAGNOSIS — I82491 Acute embolism and thrombosis of other specified deep vein of right lower extremity: Secondary | ICD-10-CM | POA: Diagnosis not present

## 2016-09-01 DIAGNOSIS — Z880 Allergy status to penicillin: Secondary | ICD-10-CM | POA: Insufficient documentation

## 2016-09-01 DIAGNOSIS — K831 Obstruction of bile duct: Secondary | ICD-10-CM | POA: Diagnosis not present

## 2016-09-01 DIAGNOSIS — R1013 Epigastric pain: Secondary | ICD-10-CM | POA: Diagnosis not present

## 2016-09-01 DIAGNOSIS — Z79899 Other long term (current) drug therapy: Secondary | ICD-10-CM | POA: Insufficient documentation

## 2016-09-01 DIAGNOSIS — I82401 Acute embolism and thrombosis of unspecified deep veins of right lower extremity: Secondary | ICD-10-CM

## 2016-09-01 LAB — COMPREHENSIVE METABOLIC PANEL
ALT: 112 U/L — AB (ref 17–63)
AST: 103 U/L — AB (ref 15–41)
Albumin: 3.1 g/dL — ABNORMAL LOW (ref 3.5–5.0)
Alkaline Phosphatase: 331 U/L — ABNORMAL HIGH (ref 38–126)
Anion gap: 10 (ref 5–15)
BUN: 16 mg/dL (ref 6–20)
CALCIUM: 8.4 mg/dL — AB (ref 8.9–10.3)
CO2: 22 mmol/L (ref 22–32)
Chloride: 101 mmol/L (ref 101–111)
Creatinine, Ser: 0.72 mg/dL (ref 0.61–1.24)
GFR calc non Af Amer: >60 mL/min (ref >60)
GLUCOSE: 178 mg/dL — AB (ref 65–99)
Potassium: 3.7 mmol/L (ref 3.5–5.1)
SODIUM: 133 mmol/L — AB (ref 135–145)
TOTAL PROTEIN: 6.8 g/dL (ref 6.5–8.1)
Total Bilirubin: 4 mg/dL — ABNORMAL HIGH (ref 0.3–1.2)

## 2016-09-01 LAB — CBC WITH DIFFERENTIAL/PLATELET
BASOS ABS: 0.1 10*3/uL (ref 0–0.1)
BASOS PCT: 1 %
EOS ABS: 0.1 10*3/uL (ref 0–0.7)
Eosinophils Relative: 1 %
HEMATOCRIT: 37.2 % — AB (ref 40.0–52.0)
HEMOGLOBIN: 12.6 g/dL — AB (ref 13.0–18.0)
Lymphocytes Relative: 6 %
Lymphs Abs: 0.7 10*3/uL — ABNORMAL LOW (ref 1.0–3.6)
MCH: 28.5 pg (ref 26.0–34.0)
MCHC: 33.8 g/dL (ref 32.0–36.0)
MCV: 84.3 fL (ref 80.0–100.0)
Monocytes Absolute: 1 10*3/uL (ref 0.2–1.0)
Monocytes Relative: 8 %
NEUTROS ABS: 10.4 10*3/uL — AB (ref 1.4–6.5)
NEUTROS PCT: 84 %
Platelets: 186 10*3/uL (ref 150–440)
RBC: 4.41 MIL/uL (ref 4.40–5.90)
RDW: 13 % (ref 11.5–14.5)
WBC: 12.4 10*3/uL — AB (ref 3.8–10.6)

## 2016-09-01 LAB — C-REACTIVE PROTEIN: CRP: 17.8 mg/dL — AB (ref <1.0)

## 2016-09-01 MED ORDER — PROCHLORPERAZINE MALEATE 10 MG PO TABS: 10.0000 mg | ORAL_TABLET | Freq: Four times a day (QID) | ORAL | 1 refills | Status: DC | PRN

## 2016-09-01 MED ORDER — LIDOCAINE-PRILOCAINE 2.5-2.5 % EX CREA: TOPICAL_CREAM | CUTANEOUS | 3 refills | Status: DC

## 2016-09-01 MED ORDER — ONDANSETRON HCL 8 MG PO TABS: 8.0000 mg | ORAL_TABLET | Freq: Three times a day (TID) | ORAL | 1 refills | Status: DC | PRN

## 2016-09-01 MED ORDER — LOPERAMIDE HCL 2 MG PO TABS: 2.0000 mg | ORAL_TABLET | Freq: Four times a day (QID) | ORAL | 1 refills | Status: DC | PRN

## 2016-09-01 MED ORDER — ENOXAPARIN SODIUM 100 MG/ML ~~LOC~~ SOLN: 100.0000 mg | Freq: Two times a day (BID) | SUBCUTANEOUS | 0 refills | Status: AC

## 2016-09-01 MED ORDER — FENTANYL 25 MCG/HR TD PT72: 25.0000 ug | MEDICATED_PATCH | TRANSDERMAL | 0 refills | Status: AC

## 2016-09-01 MED ORDER — OXYCODONE HCL 5 MG PO TABS: 5.0000 mg | ORAL_TABLET | Freq: Four times a day (QID) | ORAL | 0 refills | Status: AC | PRN

## 2016-09-01 NOTE — Progress Notes (Signed)
  Oncology Nurse Navigator Documentation Met with Mr. Tristan Clark and his spouse prior to and during consult with Dr. Rogue Bussing. Introduced Therapist, nutritional and provided contact information for him to reach out for future needs, support or education. Briefly went over port a cath and showed them a port. Plan of care to include chemo class, port placement, U/S RLE for behind the knee pain, and FOLFIRINOX. He was encouraged to reach out for any needs after he leaves today.  Navigator Location: [facility] (09/01/16 1024) Navigator Encounter Type: Initial MedOnc;Diagnostic Results (09/01/16 1024)   Abnormal Finding Date: 08/02/16 (09/01/16 1024) Confirmed Diagnosis Date: 08/30/16 (09/01/16 1024)     Patient Visit Type: MedOnc;Initial (09/01/16 1024) Treatment Phase: Pre-Tx/Tx Discussion (09/01/16 1024) Barriers/Navigation Needs: Education (09/01/16 1024) Education:  (port a cath) (09/01/16 1024)              Acuity: Level 2 (09/01/16 1024)   Acuity Level 2: Initial guidance, education and coordination as needed;Educational needs;Ongoing guidance and education throughout treatment as needed (09/01/16 1024)     Time Spent with Patient: 60 (09/01/16 1024)

## 2016-09-01 NOTE — Progress Notes (Signed)
lovenox teaching performed- side effects, self administration techniques and needle disposal discussed with patient. I asked patient not to take his aleve or any nsaid products while taking lovenox. Pt watched video on lovenox self admin. And written information was provided with patient on lovenox and dvt.  MD explained and RN explained that clot involved the right calf veins, right popliteal vein and the distal right femoral vein.  Patient understands that he should continue to drink 2-3 liters of fluid to maintain hydration. Explained that dvts could lead to a more serious conditions-heart attack, pulmonary emboli, and stroke. He gave verbal understanding.  Teach back process performed.  Pt d/c with lovenox kit with sharpe containers and alcohol wipes, dvd on lovenox.

## 2016-09-01 NOTE — Progress Notes (Signed)
[facility] NOTE  Patient Care Team: Margo Common, PA as PCP - General (Physician Assistant) Clent Jacks, RN as Registered Nurse  CHIEF COMPLAINTS/PURPOSE OF CONSULTATION:  Pancreatic cancer  #  Oncology History   # SEP 2017- PANCREATIC ADENO CA [s/p liver bx]; PET- pan head mass; multiple liver lesions.   # FOLFIRINOX     Primary cancer of head of pancreas ([place])   09/01/2016 Initial Diagnosis    Primary cancer of head of pancreas ([place])       HISTORY OF PRESENTING ILLNESS:  Tristan Clark 60 y.o.  male with no significant past medical history noted to have right upper quadrant abdominal pain for the last 6-8 weeks. Progressive getting worse. No nausea no vomiting. This was worked up with a CT scan that showed multiple lesions in the liver followed by a PET scan- that also showed multiple liver lesions and also Pancrecarb mass. Patient had an ultrasound and biopsy of the liver; he is here accompanied by his wife to review the pathology/next plan of care.  He continues to pain in the epigastric region in the right upper quadrant. He is on hydrocodone taking 1-2 a day. Suboptimal pain control. Intermittent constipation. No nausea no vomiting. Poor appetite weight loss of about 10-15 pounds.  Interestingly also complains of pain in his right calf difficulty in squatting. Denies any shortness of breath. Patient also complains of intermittent night sweats and fevers up to 102. No cough.    ROS: A complete 10 point review of system is done which is negative except mentioned above in history of present illness  MEDICAL HISTORY:  Past Medical History:  Diagnosis Date  . Arthritis   . Cancer [facility])    skin    SURGICAL HISTORY: Past Surgical History:  Procedure Laterality Date  . COLONOSCOPY  2009   Normal; unsure of MD  . VASECTOMY  2001    SOCIAL HISTORY: carpenter; quit smoking; mod alcohol hard liqor; elon.. Lives with wife..  Social History    Social History  . Marital status: Married    Spouse name: N/A  . Number of children: 2  . Years of education: N/A   Occupational History  . Carpenter    Social History Main Topics  . Smoking status: Former Smoker    Quit date: 08/29/1993  . Smokeless tobacco: Never Used  . Alcohol use 0.0 oz/week     Comment: occasional use;  2-3 times a week drinks a Bourbon  . Drug use: No  . Sexual activity: Not on file   Other Topics Concern  . Not on file   Social History Narrative  . No narrative on file    FAMILY HISTORY: limited; grandpa- mat- in 60s; colon cancer;  Family History  Problem Relation Age of Onset  . Arthritis Sister   . Nephrolithiasis Sister   . Alcohol abuse Brother   . Cancer Maternal Grandmother   . Kidney cancer Neg Hx   . Prostate cancer Neg Hx     ALLERGIES:  is allergic to amoxicillin.  MEDICATIONS:  Current Outpatient Prescriptions  Medication Sig Dispense Refill  . EPINEPHrine (EPIPEN 2-PAK) 0.3 mg/0.3 mL IJ SOAJ injection Inject 1 Dose as directed as needed. For severe allergic reations    . HYDROcodone-acetaminophen (NORCO) 5-325 MG tablet Take 1 tablet by mouth every 6 (six) hours as needed for moderate pain. 30 tablet 0  . ibuprofen (ADVIL,MOTRIN) 200 MG tablet Take 200 mg by mouth  every 6 (six) hours as needed.    . loratadine (CLARITIN) 10 MG tablet Take 1 tablet by mouth daily.    Marland Kitchen omeprazole (PRILOSEC) 40 MG capsule Take by mouth.    . valACYclovir (VALTREX) 1000 MG tablet TAKE 1 TABLET BY MOUTH TWICE A DAY AS NEEDED 30 tablet 3  . fentaNYL (DURAGESIC - DOSED MCG/HR) 25 MCG/HR patch Place 1 patch (25 mcg total) onto the skin every 3 (three) days. 5 patch 0  . lidocaine-prilocaine (EMLA) cream Apply to affected area once 30 g 3  . loperamide (IMODIUM A-D) 2 MG tablet Take 1 tablet (2 mg total) by mouth 4 (four) times daily as needed. Take 2 at diarrhea onset , then 1 every 2hr until 12hrs with no BM. May take 2 every 4hrs at night. If  diarrhea recurs repeat. 100 tablet 1  . ondansetron (ZOFRAN) 8 MG tablet Take 1 tablet (8 mg total) by mouth every 8 (eight) hours as needed for refractory nausea / vomiting. Start on day 3 after chemotherapy. 40 tablet 1  . oxyCODONE (OXY IR/ROXICODONE) 5 MG immediate release tablet Take 1 tablet (5 mg total) by mouth every 6 (six) hours as needed for severe pain. 60 tablet 0  . prochlorperazine (COMPAZINE) 10 MG tablet Take 1 tablet (10 mg total) by mouth every 6 (six) hours as needed (NAUSEA). 30 tablet 1   No current facility-administered medications for this visit.       Marland Kitchen  PHYSICAL EXAMINATION: ECOG PERFORMANCE STATUS: 1 - Symptomatic but completely ambulatory  Vitals:   09/01/16 0845  BP: 130/86  Pulse: 98  Resp: 17  Temp: 99.2 F (37.3 C)   There were no vitals filed for this visit.  GENERAL: Well-nourished well-developed; Alert, no distress and comfortable.  With his wife.  EYES: no pallor or icterus OROPHARYNX: no thrush or ulceration; good dentition  NECK: supple, no masses felt LYMPH:  no palpable lymphadenopathy in the cervical, axillary or inguinal regions LUNGS: clear to auscultation and  No wheeze or crackles HEART/CVS: regular rate & rhythm and no murmurs; No lower extremity edema ABDOMEN: abdomen soft, non-tender and normal bowel sounds Musculoskeletal:no cyanosis of digits and no clubbing  PSYCH: alert & oriented x 3 with fluent speech NEURO: no focal motor/sensory deficits SKIN:  no rashes or significant lesions  LABORATORY DATA:  I have reviewed the data as listed Lab Results  Component Value Date   WBC 11.4 (H) 08/29/2016   HGB 12.9 (L) 08/29/2016   HCT 36.7 (L) 08/29/2016   MCV 83.3 08/29/2016   PLT 185 08/29/2016    Recent Labs  07/29/16 0931 08/11/16 1429  NA 143 142  K 4.6 3.9  CL 103 102  CO2 24 23  GLUCOSE 121* 95  BUN 13 13  CREATININE 0.98 0.90  CALCIUM 9.4 9.0  GFRNONAA 84 93  GFRAA 97 108  PROT 6.6  --   ALBUMIN 4.3  --    AST 29  --   ALT 33  --   ALKPHOS 129*  --   BILITOT 0.7  --     RADIOGRAPHIC STUDIES: I have personally reviewed the radiological images as listed and agreed with the findings in the report. Dg Chest 2 View  Result Date: 08/03/2016 CLINICAL DATA:  Incidental pulmonary and liver lesions on abdominal CT yesterday. No chest complaints or cardiac history EXAM: CHEST  2 VIEW COMPARISON:  Abdominal and pelvic CT scan of August 02, 2016 and chest x-ray of September 27, 2011 FINDINGS: The lungs are adequately inflated. There is no focal infiltrate. No pulmonary nodules are observed. The heart and pulmonary vascularity are normal. The mediastinum is normal in width. There is mild tortuosity of the descending thoracic aorta. There is no pleural effusion. The bony thorax exhibits no acute abnormality. There is gentle dextrocurvature centered at approximately T8. IMPRESSION: No active cardiopulmonary disease is observed. The 3-4 mm lower lobe lung nodules demonstrated on yesterday's CT scan are not visible on today's study. Electronically Signed   By: David  Martinique M.D.   On: 08/03/2016 09:04   Nm Pet Image Initial (pi) Skull Base To Thigh  Result Date: 08/16/2016 CLINICAL DATA:  Initial treatment strategy for hepatic lesion. EXAM: NUCLEAR MEDICINE PET SKULL BASE TO THIGH TECHNIQUE: 12.7 mCi F-18 FDG was injected intravenously. Full-ring PET imaging was performed from the skull base to thigh after the radiotracer. CT data was obtained and used for attenuation correction and anatomic localization. FASTING BLOOD GLUCOSE:  Value: 118 mg/dl COMPARISON:  CT without contrast 08/02/2016 FINDINGS: NECK No hypermetabolic lymph nodes in the neck. CHEST The are 2 small nodules at the lung bases with no associated metabolic activity measuring 3 mm in RIGHT lower lobe on image 132 and 3 mm LEFT lower lobe on image 139. This focus of hypermetabolic activity at the LEFT lung base just above the diaphragm SUV max equal 2.2. No  clear parenchymal lung lesion identified. This may represent misregistration with hepatic metastasis. ABDOMEN/PELVIS There are multiple foci of intense metabolic activity associated with rounded hypodense lesions within the LEFT a nd RIGHT hepatic lobe. For example lesion in the caudate lobe SUV max equal 8.4. Hypermetabolic lesion in the anterior LEFT hepatic lobe with SUV max equal 10.9. There approximately a 12 lesions in the liver of varying sizes. There is a hypermetabolic focus within the pancreatic head with SUV max equals 7.3. There is a rounded fullness measuring 2.7 in this the location but difficult to define on noncontrast CT. There is a hypermetabolic peripancreatic lymph node dorsal to the pancreas measuring 11 mm with SUV max equal 8.4. The hypermetabolic periaortic lymph node between the IVC and the aorta on image 187 fused data set. No abnormal metabolic activity associated:  . SKELETON No focal hypermetabolic activity to suggest skeletal metastasis. IMPRESSION: 1. Multiple hypermetabolic lesions within LEFT and RIGHT hepatic lobe consistent with hepatic metastasis. 2. High suspicion for PANCREATIC ADENOCARCINOMA with hypermetabolic lesion at the head of the pancreas. Difficult define a true lesion on noncontrast CT. No duct dilatation. Consider hepatic biopsy to confirm versus endoscopic ultrasound. 3. Peripancreatic and periaortic hypermetabolic metastatic lymph nodes. 4. No evidence of colorectal primary carcinoma. 5. Small nodules of the lung bases without metabolic activity. These nodules are below accurate PET characterization. Recommend attention on follow-up. Electronically Signed   By: Suzy Bouchard M.D.   On: 08/16/2016 13:11   US Biopsy  Result Date: 08/29/2016 INDICATION: 60 year old male with a history of pancreatic lesion and possible pancreatic adenocarcinoma metastases. EXAM: ULTRASOUND BIOPSY CORE LIVER MEDICATIONS: None. ANESTHESIA/SEDATION: Moderate (conscious) sedation was  employed during this procedure. A total of Versed 0.25 mg and Fentanyl 75 mcg was administered intravenously. Moderate Sedation Time: 20 minutes. The patient's level of consciousness and vital signs were monitored continuously by radiology nursing throughout the procedure under my direct supervision. FLUOROSCOPY TIME:  None COMPLICATIONS: None PROCEDURE: Informed written consent was obtained from the patient after a thorough discussion of the procedural risks, benefits and alternatives. All questions were addressed.  Maximal Sterile Barrier Technique was utilized including caps, mask, sterile gowns, sterile gloves, sterile drape, hand hygiene and skin antiseptic. A timeout was performed prior to the initiation of the procedure. Ultrasound survey of the abdomen was performed with images stored and sent to PACs. The patient is prepped and draped in the usual sterile fashion. The skin and subcutaneous tissues overlying the right upper quadrant were generously infiltrated 1% lidocaine for local anesthesia. Small stab incision was made, and with ultrasound guidance, 17 gauge guide needle was advanced into heterogeneously echoic/hypoechoic lesion of the segment 6 liver. Multiple core biopsy were acquired. Samples placed into formalin. Two Gel-Foam pledgets were infused. Needle was removed and a final image was stored. Patient tolerated the procedure well and remained hemodynamically stable throughout. No complications were encountered and no significant blood loss. IMPRESSION: Status post ultrasound-guided biopsy of right liver lobe lesion, segment 6, with heterogeneously echoic characteristics. Tissue specimen sent to pathology for complete histopathologic analysis. Signed, Dulcy Fanny. Earleen Newport, DO [facility] Radiology Electronically Signed   By: Corrie Mckusick D.O.   On: 08/29/2016 10:16    ASSESSMENT & PLAN:   Primary cancer of head of pancreas ([place]) # Metastatic  pancreatic adenocarcinoma to the liver. Review the pathology and stage with the family detail.  # Discussed palliative chemotherapy to help slow down the growth of the disease; and improve survival. Discussed mean survival is approximately 8-12 months.  # Given his good performance status lack of any comorbidities recommend- FOLFIRINOX every 2 weeks. Discussed the potential side effects including but not limited to-increasing fatigue, nausea vomiting, diarrhea, hair loss, sores in the mouth, increase risk of infection and also neuropathy.   Growth factor-Neulasta/On pro would be given as prophylaxis for chemotherapy-induced neutropenia to prevent febrile neutropenias.  #Recommend port placement. Recommend starting chemotherapy next week; follow-up with me the day of chemotherapy again.   # Right leg swelling highly suspicious for DVT recommend stat Dopplers recommend Lovenox if possible.  # Pain control. Recommend fentanyl patch 25 g; oxycodone for breakthrough pain 5 mg every 6 hours or so. Prescriptions given.  # Check CBC CMP CA 99 CRP today.  # I reviewed the blood work- with the patient in detail; also reviewed the imaging independently [as summarized above]; and with the patient in detail.    Thank you Mr. Natale Milch PA  for allowing me to participate in the care of your pleasant patient. Please do not hesitate to contact me with questions or concerns in the interim.    All questions were answered. The patient knows to call the clinic with any problems, questions or concerns.    Cammie Sickle, MD 09/01/2016 12:12 PM

## 2016-09-01 NOTE — Progress Notes (Signed)
START ON PATHWAY REGIMEN - Pancreatic  PANOS55: FOLFIRINOX q14 Days x 6 Months   A cycle is every 14 days:     Oxaliplatin (Eloxatin(R)) 85 mg/m2 in 250 mL D5W IV over 2 hours day 1 Dose Mod: None     Leucovorin 400 mg/m2 in 250 mL D5W IV over 90 minutes day 1 to run concurrently with irinotecan Dose Mod: None     Irinotecan (Camptosar(R)) 180 mg/m2 in 500 mL D5W IV over 90 minutes day 1 Dose Mod: None     5-Fluorouracil 400 mg/m2 IV bolus over 2-4 minutes day 1 Dose Mod: None     5-Fluorouracil 2,400 mg/m2 in _____mL NS IV as a 46 hour infusion day 1 Dose Mod: None Additional Orders: Oxaliplatin incompatible with NS fluids. Flush line with D5W prior to administration.  **Always confirm dose/schedule in your pharmacy ordering system**    Patient Characteristics: Adenocarcinoma, Metastatic Disease, First Line, PS = 0, 1 Current evidence of distant metastases? Yes AJCC T Stage: X AJCC N Stage: X AJCC Stage Grouping: IV AJCC M Stage: X Histology: Adenocarcinoma Line of therapy: First Line Would you be surprised if this patient died  in the next year? I would be surprised if this patient died in the next year  Intent of Therapy: Non-Curative / Palliative Intent, Discussed with Patient

## 2016-09-01 NOTE — Addendum Note (Signed)
Addended by: Renita Papa R on: 09/01/2016 02:00 PM   Modules accepted: Orders

## 2016-09-01 NOTE — Patient Instructions (Signed)
Deep Vein Thrombosis  A deep vein thrombosis (DVT) is a blood clot (thrombus) that usually occurs in a deep, larger vein of the lower leg or the pelvis, or in an upper extremity such as the arm. These are dangerous and can lead to serious and even life-threatening complications if the clot travels to the lungs. A DVT can damage the valves in your leg veins so that instead of flowing upward, the blood pools in the lower leg. This is called post-thrombotic syndrome, and it can result in pain, swelling, discoloration, and sores on the leg. CAUSES A DVT is caused by the formation of a blood clot in your leg, pelvis, or arm. Usually, several things contribute to the formation of blood clots. A clot may develop when:  Your blood flow slows down.  Your vein becomes damaged in some way.  You have a condition that makes your blood clot more easily. RISK FACTORS A DVT is more likely to develop in:  People who are older, especially over 43 years of age.  People who are overweight (obese).  People who sit or lie still for a long time, such as during long-distance travel (over 4 hours), bed rest, hospitalization, or during recovery from certain medical conditions like a stroke.  People who do not engage in much physical activity (sedentary lifestyle).  People who have chronic breathing disorders.  People who have a personal or family history of blood clots or blood clotting disease.  People who have peripheral vascular disease (PVD), diabetes, or some types of cancer.  People who have heart disease, especially if the person had a recent heart attack or has congestive heart failure.  People who have neurological diseases that affect the legs (leg paresis).  People who have had a traumatic injury, such as breaking a hip or leg.  People who have recently had major or lengthy surgery, especially on the hip, knee, or abdomen.  People who have had a central line placed inside a large  vein.  People who take medicines that contain the hormone estrogen. These include birth control pills and hormone replacement therapy.  Pregnancy or during childbirth or the postpartum period.  Long plane flights (over 8 hours). SIGNS AND SYMPTOMS Symptoms of a DVT can include:   Swelling of your leg or arm, especially if one side is much worse.  Warmth and redness of your leg or arm, especially if one side is much worse.  Pain in your arm or leg. If the clot is in your leg, symptoms may be more noticeable or worse when you stand or walk.  A feeling of pins and needles, if the clot is in the arm. The symptoms of a DVT that has traveled to the lungs (pulmonary embolism, PE) usually start suddenly and include:  Shortness of breath while active or at rest.  Coughing or coughing up blood or blood-tinged mucus.  Chest pain that is often worse with deep breaths.  Rapid or irregular heartbeat.  Feeling light-headed or dizzy.  Fainting.  Feeling anxious.  Sweating. There may also be pain and swelling in a leg if that is where the blood clot started. These symptoms may represent a serious problem that is an emergency. Do not wait to see if the symptoms will go away. Get medical help right away. Call your local emergency services (911 in the [place] Do not drive yourself to the hospital. DIAGNOSIS Your health care provider will take a medical history and perform a physical exam. You may  also have other tests, including:  Blood tests to assess the clotting properties of your blood.  Imaging tests, such as CT, ultrasound, MRI, X-ray, and other tests to see if you have clots anywhere in your body. TREATMENT After a DVT is identified, it can be treated. The type of treatment that you receive depends on many factors, such as the cause of your DVT, your risk for bleeding or developing more clots, and other medical conditions that you have. Sometimes, a combination of treatments is  necessary. Treatment options may be combined and include:  Monitoring the blood clot with ultrasound.  Taking medicines by mouth, such as newer blood thinners (anticoagulants), thrombolytics, or warfarin.  Taking anticoagulant medicine by injection or through an IV tube.  Wearing compression stockings or using different types ofdevices.  Surgery (rare) to remove the blood clot or to place a filter in your abdomen to stop the blood clot from traveling to your lungs. Treatments for a DVT are often divided into immediate treatment and long-term treatment (up to 3 months after DVT). You can work with your health care provider to choose the treatment program that is best for you. HOME CARE INSTRUCTIONS If you are taking a newer oral anticoagulant:  Take the medicine every single day at the same time each day.  Understand what foods and drugs interact with this medicine.  Understand that there are no regular blood tests required when using this medicine.  Understand the side effects of this medicine, including excessive bruising or bleeding. Ask your health care provider or pharmacist about other possible side effects. If you are taking warfarin:  Understand how to take warfarin and know which foods can affect how warfarin works in Veterinary surgeon.  Understand that it is dangerous to take too much or too little warfarin. Too much warfarin increases the risk of bleeding. Too little warfarin continues to allow the risk for blood clots.  Follow your PT and INR blood testing schedule. The PT and INR results allow your health care provider to adjust your dose of warfarin. It is very important that you have your PT and INR tested as often as told by your health care provider.  Avoid major changes in your diet, or tell your health care provider before you change your diet. Arrange a visit with a registered dietitian to answer your questions. Many foods, especially foods that are high in vitamin K, can  interfere with warfarin and affect the PT and INR results. Eat a consistent amount of foods that are high in vitamin K, such as:  Spinach, kale, broccoli, cabbage, collard greens, turnip greens, Brussels sprouts, peas, cauliflower, seaweed, and parsley.  Beef liver and pork liver.  Green tea.  Soybean oil.  Tell your health care provider about any and all medicines, vitamins, and supplements that you take, including aspirin and other over-the-counter anti-inflammatory medicines. Be especially cautious with aspirin and anti-inflammatory medicines. Do not take those before you ask your health care provider if it is safe to do so. This is important because many medicines can interfere with warfarin and affect the PT and INR results.  Do not start or stop taking any over-the-counter or prescription medicine unless your health care provider or pharmacist tells you to do so. If you take warfarin, you will also need to do these things:  Hold pressure over cuts for longer than usual.  Tell your dentist and other health care providers that you are taking warfarin before you have any procedures in  which bleeding may occur.  Avoid alcohol or drink very small amounts. Tell your health care provider if you change your alcohol intake.  Do not use tobacco products, including cigarettes, chewing tobacco, and e-cigarettes. If you need help quitting, ask your health care provider.  Avoid contact sports. General Instructions  Take over-the-counter and prescription medicines only as told by your health care provider. Anticoagulant medicines can have side effects, including easy bruising and difficulty stopping bleeding. If you are prescribed an anticoagulant, you will also need to do these things:  Hold pressure over cuts for longer than usual.  Tell your dentist and other health care providers that you are taking anticoagulants before you have any procedures in which bleeding may occur.  Avoid contact  sports.  Wear a medical alert bracelet or carry a medical alert card that says you have had a PE.  Ask your health care provider how soon you can go back to your normal activities. Stay active to prevent new blood clots from forming.  Make sure to exercise while traveling or when you have been sitting or standing for a long period of time. It is very important to exercise. Exercise your legs by walking or by tightening and relaxing your leg muscles often. Take frequent walks.  Wear compression stockings as told by your health care provider to help prevent more blood clots from forming.  Do not use tobacco products, including cigarettes, chewing tobacco, and e-cigarettes. If you need help quitting, ask your health care provider.  Keep all follow-up appointments with your health care provider. This is important. PREVENTION Take these actions to decrease your risk of developing another DVT:  Exercise regularly. For at least 30 minutes every day, engage in:  Activity that involves moving your arms and legs.  Activity that encourages good blood flow through your body by increasing your heart rate.  Exercise your arms and legs every hour during long-distance travel (over 4 hours). Drink plenty of water and avoid drinking alcohol while traveling.  Avoid sitting or lying in bed for long periods of time without moving your legs.  Maintain a weight that is appropriate for your height. Ask your health care provider what weight is healthy for you.  If you are a woman who is over 43 years of age, avoid unnecessary use of medicines that contain estrogen. These include birth control pills.  Do not smoke, especially if you take estrogen medicines. If you need help quitting, ask your health care provider. If you are hospitalized, prevention measures may include:  Early walking after surgery, as soon as your health care provider says that it is safe.  Receiving anticoagulants to prevent blood  clots.If you cannot take anticoagulants, other options may be available, such as wearing compression stockings or using different types of devices. SEEK IMMEDIATE MEDICAL CARE IF:  You have new or increased pain, swelling, or redness in an arm or leg.  You have numbness or tingling in an arm or leg.  You have shortness of breath while active or at rest.  You have chest pain.  You have a rapid or irregular heartbeat.  You feel light-headed or dizzy.  You cough up blood.  You notice blood in your vomit, bowel movement, or urine. These symptoms may represent a serious problem that is an emergency. Do not wait to see if the symptoms will go away. Get medical help right away. Call your local emergency services (911 in the [place] Do not drive yourself to the hospital.  This information is not intended to replace advice given to you by your health care provider. Make sure you discuss any questions you have with your health care provider.   Document Released: 12/05/2005 Document Revised: 08/26/2015 Document Reviewed: 04/01/2015 Elsevier Interactive Patient Education 2016 [employer]    Enoxaparin injection What is this medicine? ENOXAPARIN (ee nox a PA rin) is used after knee, hip, or abdominal surgeries to prevent blood clotting. It is also used to treat existing blood clots in the lungs or in the veins. This medicine may be used for other purposes; ask your health care provider or pharmacist if you have questions. What should I tell my health care provider before I take this medicine? They need to know if you have any of these conditions: -bleeding disorders, hemorrhage, or hemophilia -infection of the heart or heart valves -kidney or liver disease -previous stroke -prosthetic heart valve -recent surgery or delivery of a baby -ulcer in the stomach or intestine, diverticulitis, or other bowel disease -an unusual or allergic reaction to enoxaparin, heparin, pork or pork products,  other medicines, foods, dyes, or preservatives -pregnant or trying to get pregnant -breast-feeding How should I use this medicine? This medicine is for injection under the skin. It is usually given by a health-care professional. You or a family member may be trained on how to give the injections. If you are to give yourself injections, make sure you understand how to use the syringe, measure the dose if necessary, and give the injection. To avoid bruising, do not rub the site where this medicine has been injected. Do not take your medicine more often than directed. Do not stop taking except on the advice of your doctor or health care professional. Make sure you receive a puncture-resistant container to dispose of the needles and syringes once you have finished with them. Do not reuse these items. Return the container to your doctor or health care professional for proper disposal. Talk to your pediatrician regarding the use of this medicine in children. Special care may be needed. Overdosage: If you think you have taken too much of this medicine contact a poison control center or emergency room at once. NOTE: This medicine is only for you. Do not share this medicine with others. What if I miss a dose? If you miss a dose, take it as soon as you can. If it is almost time for your next dose, take only that dose. Do not take double or extra doses. What may interact with this medicine? -aspirin and aspirin-like medicines -certain medicines that treat or prevent blood clots -dipyridamole -NSAIDs, medicines for pain and inflammation, like ibuprofen or naproxen This list may not describe all possible interactions. Give your health care provider a list of all the medicines, herbs, non-prescription drugs, or dietary supplements you use. Also tell them if you smoke, drink alcohol, or use illegal drugs. Some items may interact with your medicine. What should I watch for while using this medicine? Visit your  doctor or health care professional for regular checks on your progress. Your condition will be monitored carefully while you are receiving this medicine. Notify your doctor or health care professional and seek emergency treatment if you develop breathing problems; changes in vision; chest pain; severe, sudden headache; pain, swelling, warmth in the leg; trouble speaking; sudden numbness or weakness of the face, arm, or leg. These can be signs that your condition has gotten worse. If you are going to have surgery, tell your doctor or health  care professional that you are taking this medicine. Do not stop taking this medicine without first talking to your doctor. Be sure to refill your prescription before you run out of medicine. Avoid sports and activities that might cause injury while you are using this medicine. Severe falls or injuries can cause unseen bleeding. Be careful when using sharp tools or knives. Consider using an Copy. Take special care brushing or flossing your teeth. Report any injuries, bruising, or red spots on the skin to your doctor or health care professional. What side effects may I notice from receiving this medicine? Side effects that you should report to your doctor or health care professional as soon as possible: -allergic reactions like skin rash, itching or hives, swelling of the face, lips, or tongue -feeling faint or lightheaded, falls -signs and symptoms of bleeding such as bloody or black, tarry stools; red or dark-brown urine; spitting up blood or brown material that looks like coffee grounds; red spots on the skin; unusual bruising or bleeding from the eye, gums, or nose Side effects that usually do not require medical attention (report to your doctor or health care professional if they continue or are bothersome): -pain, redness, or irritation at site where injected This list may not describe all possible side effects. Call your doctor for medical advice about  side effects. You may report side effects to FDA at 1-800-FDA-1088. Where should I keep my medicine? Keep out of the reach of children. Store at room temperature between 15 and 30 degrees C (59 and 86 degrees F). Do not freeze. If your injections have been specially prepared, you may need to store them in the refrigerator. Ask your pharmacist. Throw away any unused medicine after the expiration date. NOTE: This sheet is a summary. It may not cover all possible information. If you have questions about this medicine, talk to your doctor, pharmacist, or health care provider.    2016, Elsevier/[employer] Standard. (2014-04-08 16:06:21)      How and Where to Give Subcutaneous Enoxaparin Injections Enoxaparin is an injectable medicine. It is used to help prevent blood clots from developing in your veins. Health care providers often use anticoagulants like enoxaparin to prevent clots following surgery. Enoxaparin is also used in combination with other medicines to treat blood clots and heart attacks. If blood clots are left untreated, they can be life threatening.  Enoxaparin comes in single-use syringes. You inject enoxaparin through a syringe into your belly (abdomen). You should change the injection site each time you give yourself a shot. Continue the enoxaparin injections as directed by your health care provider. Your health care provider will use blood clotting test results to decide when you can safely stop using enoxaparin injections. If your health care provider prescribes any additional medicines, use the medicines exactly as directed. HOW DO I INJECT ENOXAPARIN?  1. Wash your hands with soap and water. 2. Clean the selected injection site as directed by your health care provider. 3. Remove the needle cap by pulling it straight off the syringe. 4. Hold the syringe like a pencil using your writing hand. 5. Use your other hand to pinch and hold an inch of the cleansed skin. 6. Insert the entire  needle straight down into the fold of skin. 7. Push the plunger with your thumb until the syringe is empty. 8. Pull the needle straight out of your skin. 9. Enoxaparin injection prefilled syringes and graduated prefilled syringes are available with a system that shields the needle after  injection. After you have completed your injection and removed the needle from your skin, firmly push down on the plunger. The protective sleeve will automatically cover the needle and you will hear a click. The click means the needle is safely covered. 10. Place the syringe in the nearest needle box, also called a sharps container. If you do not have a sharps container, you can use a hard-sided plastic container with a secure lid, such as an empty laundry detergent bottle. WHAT ELSE DO I NEED TO KNOW?  Do not use enoxaparin if:  You have allergies to heparin or pork products.  You have been diagnosed with a condition called thrombocytopenia.  Do not use the syringe or needle more than one time.  Use medicines only as directed by your health care provider.  Changes in medicines, supplements, diet, and illness can affect your anticoagulation therapy. Be sure to inform your health care provider of any of these changes.  It is important that you tell all of your health care providers and your dentist that you are taking an anticoagulant, especially if you are injured or plan to have any type of procedure.  While on anticoagulants, you will need to have blood tests done routinely as directed by your health care provider.  While using this medicine, avoid physical activities or sports that could result in a fall or cause injury.  Follow up with your laboratory test and health care provider appointments as directed. It is very important to keep your appointments. Not keeping appointments could result in a chronic or permanent injury, pain, or disability.  Before giving your medicine, you should make sure the  injection is a clear and colorless or pale yellow solution. If your medicine becomes discolored or if there are particles in the syringe, do not use it and notify your health care provider.  Keep your medicine safely stored at room temperature. SEEK MEDICAL CARE IF:  You develop any rashes on your skin.  You have large areas of bruising on your skin.  You have any worsening of the condition for which you take Enoxaparin.  You develop a fever. SEEK IMMEDIATE MEDICAL CARE IF:  You develop bleeding problems such as:  Bleeding from the gums or nose that does not stop quickly.  Vomiting blood or coughing up blood.  Blood in your urine.  Blood in your stool, or stool that has a dark, tarry, or coffee grounds appearance.  A cut that does not stop bleeding within 10 minutes. These symptoms may represent a serious problem that is an emergency. Do not wait to see if the symptoms will go away. Get medical help right away. Call your local emergency services (911 in the [place] Do not drive yourself to the hospital.    This information is not intended to replace advice given to you by your health care provider. Make sure you discuss any questions you have with your health care provider.   Document Released: 10/06/2004 Document Revised: 12/26/2014 Document Reviewed: 05/22/2014 Elsevier Interactive Patient Education [employer].

## 2016-09-01 NOTE — Assessment & Plan Note (Addendum)
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 Review the pathology and stage with the family detail.  # Discussed palliative chemotherapy to help slow down the growth of the disease; and improve survival. Discussed mean survival is approximately 8-12 months.  # Given his good performance status lack of any comorbidities recommend- FOLFIRINOX every 2 weeks. Discussed the potential side effects including but not limited to-increasing fatigue, nausea vomiting, diarrhea, hair loss, sores in the mouth, increase risk of infection and also neuropathy.   Growth factor-Neulasta/On pro would be given as prophylaxis for chemotherapy-induced neutropenia to prevent febrile neutropenias.  #Recommend port placement. Recommend starting chemotherapy next week; follow-up with me the day of chemotherapy again.   # Right leg swelling highly suspicious for DVT recommend stat Dopplers recommend Lovenox if possible.  # Pain control. Recommend fentanyl patch 25 g; oxycodone for breakthrough pain 5 mg every 6 hours or so. Prescriptions given.  # Check CBC CMP CA 99 CRP today.  # I reviewed the blood work- with the patient in detail; also reviewed the imaging independently [as summarized above]; and with the patient in detail.    Thank you Mr. Natale Milch PA  for allowing me to participate in the care of your pleasant patient. Please do not hesitate to contact me with questions or concerns in the interim.

## 2016-09-02 ENCOUNTER — Other Ambulatory Visit: Payer: Self-pay | Admitting: *Deleted

## 2016-09-02 DIAGNOSIS — C250 Malignant neoplasm of head of pancreas: Secondary | ICD-10-CM

## 2016-09-02 NOTE — Patient Instructions (Signed)
Oxaliplatin Injection What is this medicine? OXALIPLATIN (ox AL i PLA tin) is a chemotherapy drug. It targets fast dividing cells, like cancer cells, and causes these cells to die. This medicine is used to treat cancers of the colon and rectum, and many other cancers. This medicine may be used for other purposes; ask your health care provider or pharmacist if you have questions. What should I tell my health care provider before I take this medicine? They need to know if you have any of these conditions: -kidney disease -an unusual or allergic reaction to oxaliplatin, other chemotherapy, other medicines, foods, dyes, or preservatives -pregnant or trying to get pregnant -breast-feeding How should I use this medicine? This drug is given as an infusion into a vein. It is administered in a hospital or clinic by a specially trained health care professional. Talk to your pediatrician regarding the use of this medicine in children. Special care may be needed. Overdosage: If you think you have taken too much of this medicine contact a poison control center or emergency room at once. NOTE: This medicine is only for you. Do not share this medicine with others. What if I miss a dose? It is important not to miss a dose. Call your doctor or health care professional if you are unable to keep an appointment. What may interact with this medicine? -medicines to increase blood counts like filgrastim, pegfilgrastim, sargramostim -probenecid -some antibiotics like amikacin, gentamicin, neomycin, polymyxin B, streptomycin, tobramycin -zalcitabine Talk to your doctor or health care professional before taking any of these medicines: -acetaminophen -aspirin -ibuprofen -ketoprofen -naproxen This list may not describe all possible interactions. Give your health care provider a list of all the medicines, herbs, non-prescription drugs, or dietary supplements you use. Also tell them if you smoke, drink alcohol, or use  illegal drugs. Some items may interact with your medicine. What should I watch for while using this medicine? Your condition will be monitored carefully while you are receiving this medicine. You will need important blood work done while you are taking this medicine. This medicine can make you more sensitive to cold. Do not drink cold drinks or use ice. Cover exposed skin before coming in contact with cold temperatures or cold objects. When out in cold weather wear warm clothing and cover your mouth and nose to warm the air that goes into your lungs. Tell your doctor if you get sensitive to the cold. This drug may make you feel generally unwell. This is not uncommon, as chemotherapy can affect healthy cells as well as cancer cells. Report any side effects. Continue your course of treatment even though you feel ill unless your doctor tells you to stop. In some cases, you may be given additional medicines to help with side effects. Follow all directions for their use. Call your doctor or health care professional for advice if you get a fever, chills or sore throat, or other symptoms of a cold or flu. Do not treat yourself. This drug decreases your body's ability to fight infections. Try to avoid being around people who are sick. This medicine may increase your risk to bruise or bleed. Call your doctor or health care professional if you notice any unusual bleeding. Be careful brushing and flossing your teeth or using a toothpick because you may get an infection or bleed more easily. If you have any dental work done, tell your dentist you are receiving this medicine. Avoid taking products that contain aspirin, acetaminophen, ibuprofen, naproxen, or ketoprofen unless instructed  may get an infection or bleed more easily. If you have any dental work done, tell your dentist you are receiving this medicine.  Avoid taking products that contain aspirin, acetaminophen, ibuprofen, naproxen, or ketoprofen unless instructed by your doctor. These medicines may hide a fever.  Do not become pregnant while taking this medicine. Women should inform their doctor if they wish to become pregnant or think they might be pregnant. There is a potential for serious side  effects to an unborn child. Talk to your health care professional or pharmacist for more information. Do not breast-feed an infant while taking this medicine.  Call your doctor or health care professional if you get diarrhea. Do not treat yourself.  What side effects may I notice from receiving this medicine?  Side effects that you should report to your doctor or health care professional as soon as possible:  -allergic reactions like skin rash, itching or hives, swelling of the face, lips, or tongue  -low blood counts - This drug may decrease the number of white blood cells, red blood cells and platelets. You may be at increased risk for infections and bleeding.  -signs of infection - fever or chills, cough, sore throat, pain or difficulty passing urine  -signs of decreased platelets or bleeding - bruising, pinpoint red spots on the skin, black, tarry stools, nosebleeds  -signs of decreased red blood cells - unusually weak or tired, fainting spells, lightheadedness  -breathing problems  -chest pain, pressure  -cough  -diarrhea  -jaw tightness  -mouth sores  -nausea and vomiting  -pain, swelling, redness or irritation at the injection site  -pain, tingling, numbness in the hands or feet  -problems with balance, talking, walking  -redness, blistering, peeling or loosening of the skin, including inside the mouth  -trouble passing urine or change in the amount of urine  Side effects that usually do not require medical attention (report to your doctor or health care professional if they continue or are bothersome):  -changes in vision  -constipation  -hair loss  -loss of appetite  -metallic taste in the mouth or changes in taste  -stomach pain  This list may not describe all possible side effects. Call your doctor for medical advice about side effects. You may report side effects to FDA at 1-800-FDA-1088.  Where should I keep my medicine?  This drug is given in a hospital or clinic and will not be stored at home.  NOTE:  This sheet is a summary. It may not cover all possible information. If you have questions about this medicine, talk to your doctor, pharmacist, or health care provider.      2016, Elsevier/[employer] Standard. (2008-07-01 17:22:47)  Irinotecan injection  What is this medicine?  IRINOTECAN (ir in oh TEE kan ) is a chemotherapy drug. It is used to treat colon and rectal cancer.  This medicine may be used for other purposes; ask your health care provider or pharmacist if you have questions.  What should I tell my health care provider before I take this medicine?  They need to know if you have any of these conditions:  -blood disorders  -dehydration  -diarrhea  -infection (especially a virus infection such as chickenpox, cold sores, or herpes)  -liver disease  -low blood counts, like low white cell, platelet, or red cell counts  -recent or ongoing radiation therapy  -an unusual or allergic reaction to irinotecan, sorbitol, other chemotherapy, other medicines, foods, dyes, or preservatives  -pregnant or trying to get pregnant  -  breast-feeding  How should I use this medicine?  This drug is given as an infusion into a vein. It is administered in a hospital or clinic by a specially trained health care professional.  Talk to your pediatrician regarding the use of this medicine in children. Special care may be needed.  Overdosage: If you think you have taken too much of this medicine contact a poison control center or emergency room at once.  NOTE: This medicine is only for you. Do not share this medicine with others.  What if I miss a dose?  It is important not to miss your dose. Call your doctor or health care professional if you are unable to keep an appointment.  What may interact with this medicine?  Do not take this medicine with any of the following medications:  -atazanavir  -certain medicines for fungal infections like itraconazole and ketoconazole  -St. John's Wort  This medicine may also interact with the following  medications:  -dexamethasone  -diuretics  -laxatives  -medicines for seizures like carbamazepine, mephobarbital, phenobarbital, phenytoin, primidone  -medicines to increase blood counts like filgrastim, pegfilgrastim, sargramostim  -prochlorperazine  -vaccines  This list may not describe all possible interactions. Give your health care provider a list of all the medicines, herbs, non-prescription drugs, or dietary supplements you use. Also tell them if you smoke, drink alcohol, or use illegal drugs. Some items may interact with your medicine.  What should I watch for while using this medicine?  Your condition will be monitored carefully while you are receiving this medicine. You will need important blood work done while you are taking this medicine.  This drug may make you feel generally unwell. This is not uncommon, as chemotherapy can affect healthy cells as well as cancer cells. Report any side effects. Continue your course of treatment even though you feel ill unless your doctor tells you to stop.  In some cases, you may be given additional medicines to help with side effects. Follow all directions for their use.  You may get drowsy or dizzy. Do not drive, use machinery, or do anything that needs mental alertness until you know how this medicine affects you. Do not stand or sit up quickly, especially if you are an older patient. This reduces the risk of dizzy or fainting spells.  Call your doctor or health care professional for advice if you get a fever, chills or sore throat, or other symptoms of a cold or flu. Do not treat yourself. This drug decreases your body's ability to fight infections. Try to avoid being around people who are sick.  This medicine may increase your risk to bruise or bleed. Call your doctor or health care professional if you notice any unusual bleeding.  Be careful brushing and flossing your teeth or using a toothpick because you may get an infection or bleed more easily. If you have any  dental work done, tell your dentist you are receiving this medicine.  Avoid taking products that contain aspirin, acetaminophen, ibuprofen, naproxen, or ketoprofen unless instructed by your doctor. These medicines may hide a fever.  Do not become pregnant while taking this medicine. Women should inform their doctor if they wish to become pregnant or think they might be pregnant. There is a potential for serious side effects to an unborn child. Talk to your health care professional or pharmacist for more information. Do not breast-feed an infant while taking this medicine.  What side effects may I notice from receiving this medicine?    Side effects that you should report to your doctor or health care professional as soon as possible:  -allergic reactions like skin rash, itching or hives, swelling of the face, lips, or tongue  -low blood counts - this medicine may decrease the number of white blood cells, red blood cells and platelets. You may be at increased risk for infections and bleeding.  -signs of infection - fever or chills, cough, sore throat, pain or difficulty passing urine  -signs of decreased platelets or bleeding - bruising, pinpoint red spots on the skin, black, tarry stools, blood in the urine  -signs of decreased red blood cells - unusually weak or tired, fainting spells, lightheadedness  -breathing problems  -chest pain  -diarrhea  -feeling faint or lightheaded, falls  -flushing, runny nose, sweating during infusion  -mouth sores or pain  -pain, swelling, redness or irritation where injected  -pain, swelling, warmth in the leg  -pain, tingling, numbness in the hands or feet  -problems with balance, talking, walking  -stomach cramps, pain  -trouble passing urine or change in the amount of urine  -vomiting as to be unable to hold down drinks or food  -yellowing of the eyes or skin  Side effects that usually do not require medical attention (report to your doctor or health care professional if they  continue or are bothersome):  -constipation  -hair loss  -headache  -loss of appetite  -nausea, vomiting  -stomach upset  This list may not describe all possible side effects. Call your doctor for medical advice about side effects. You may report side effects to FDA at 1-800-FDA-1088.  Where should I keep my medicine?  This drug is given in a hospital or clinic and will not be stored at home.  NOTE: This sheet is a summary. It may not cover all possible information. If you have questions about this medicine, talk to your doctor, pharmacist, or health care provider.      2016, Elsevier/Gold Standard. (2013-06-03 16:29:32)  Fluorouracil, 5-FU injection  What is this medicine?  FLUOROURACIL, 5-FU (flure oh YOOR a sil) is a chemotherapy drug. It slows the growth of cancer cells. This medicine is used to treat many types of cancer like breast cancer, colon or rectal cancer, pancreatic cancer, and stomach cancer.  This medicine may be used for other purposes; ask your health care provider or pharmacist if you have questions.  What should I tell my health care provider before I take this medicine?  They need to know if you have any of these conditions:  -blood disorders  -dihydropyrimidine dehydrogenase (DPD) deficiency  -infection (especially a virus infection such as chickenpox, cold sores, or herpes)  -kidney disease  -liver disease  -malnourished, poor nutrition  -recent or ongoing radiation therapy  -an unusual or allergic reaction to fluorouracil, other chemotherapy, other medicines, foods, dyes, or preservatives  -pregnant or trying to get pregnant  -breast-feeding  How should I use this medicine?  This drug is given as an infusion or injection into a vein. It is administered in a hospital or clinic by a specially trained health care professional.  Talk to your pediatrician regarding the use of this medicine in children. Special care may be needed.  Overdosage: If you think you have taken too much of this medicine  contact a poison control center or emergency room at once.  NOTE: This medicine is only for you. Do not share this medicine with others.  What if I miss a dose?  It is   important not to miss your dose. Call your doctor or health care professional if you are unable to keep an appointment.  What may interact with this medicine?  -allopurinol  -cimetidine  -dapsone  -digoxin  -hydroxyurea  -leucovorin  -levamisole  -medicines for seizures like ethotoin, fosphenytoin, phenytoin  -medicines to increase blood counts like filgrastim, pegfilgrastim, sargramostim  -medicines that treat or prevent blood clots like warfarin, enoxaparin, and dalteparin  -methotrexate  -metronidazole  -pyrimethamine  -some other chemotherapy drugs like busulfan, cisplatin, estramustine, vinblastine  -trimethoprim  -trimetrexate  -vaccines  Talk to your doctor or health care professional before taking any of these medicines:  -acetaminophen  -aspirin  -ibuprofen  -ketoprofen  -naproxen  This list may not describe all possible interactions. Give your health care provider a list of all the medicines, herbs, non-prescription drugs, or dietary supplements you use. Also tell them if you smoke, drink alcohol, or use illegal drugs. Some items may interact with your medicine.  What should I watch for while using this medicine?  Visit your doctor for checks on your progress. This drug may make you feel generally unwell. This is not uncommon, as chemotherapy can affect healthy cells as well as cancer cells. Report any side effects. Continue your course of treatment even though you feel ill unless your doctor tells you to stop.  In some cases, you may be given additional medicines to help with side effects. Follow all directions for their use.  Call your doctor or health care professional for advice if you get a fever, chills or sore throat, or other symptoms of a cold or flu. Do not treat yourself. This drug decreases your body's ability to fight  infections. Try to avoid being around people who are sick.  This medicine may increase your risk to bruise or bleed. Call your doctor or health care professional if you notice any unusual bleeding.  Be careful brushing and flossing your teeth or using a toothpick because you may get an infection or bleed more easily. If you have any dental work done, tell your dentist you are receiving this medicine.  Avoid taking products that contain aspirin, acetaminophen, ibuprofen, naproxen, or ketoprofen unless instructed by your doctor. These medicines may hide a fever.  Do not become pregnant while taking this medicine. Women should inform their doctor if they wish to become pregnant or think they might be pregnant. There is a potential for serious side effects to an unborn child. Talk to your health care professional or pharmacist for more information. Do not breast-feed an infant while taking this medicine.  Men should inform their doctor if they wish to father a child. This medicine may lower sperm counts.  Do not treat diarrhea with over the counter products. Contact your doctor if you have diarrhea that lasts more than 2 days or if it is severe and watery.  This medicine can make you more sensitive to the sun. Keep out of the sun. If you cannot avoid being in the sun, wear protective clothing and use sunscreen. Do not use sun lamps or tanning beds/booths.  What side effects may I notice from receiving this medicine?  Side effects that you should report to your doctor or health care professional as soon as possible:  -allergic reactions like skin rash, itching or hives, swelling of the face, lips, or tongue  -low blood counts - this medicine may decrease the number of white blood cells, red blood cells and platelets. You may be at increased   risk for infections and bleeding.  -signs of infection - fever or chills, cough, sore throat, pain or difficulty passing urine  -signs of decreased platelets or bleeding - bruising,  pinpoint red spots on the skin, black, tarry stools, blood in the urine  -signs of decreased red blood cells - unusually weak or tired, fainting spells, lightheadedness  -breathing problems  -changes in vision  -chest pain  -mouth sores  -nausea and vomiting  -pain, swelling, redness at site where injected  -pain, tingling, numbness in the hands or feet  -redness, swelling, or sores on hands or feet  -stomach pain  -unusual bleeding  Side effects that usually do not require medical attention (report to your doctor or health care professional if they continue or are bothersome):  -changes in finger or toe nails  -diarrhea  -dry or itchy skin  -hair loss  -headache  -loss of appetite  -sensitivity of eyes to the light  -stomach upset  -unusually teary eyes  This list may not describe all possible side effects. Call your doctor for medical advice about side effects. You may report side effects to FDA at 1-800-FDA-1088.  Where should I keep my medicine?  This drug is given in a hospital or clinic and will not be stored at home.  NOTE: This sheet is a summary. It may not cover all possible information. If you have questions about this medicine, talk to your doctor, pharmacist, or health care provider.      2016, Elsevier/Gold Standard. (2008-04-09 13:53:16)  Leucovorin injection  What is this medicine?  LEUCOVORIN (loo koe VOR in) is used to prevent or treat the harmful effects of some medicines. This medicine is used to treat anemia caused by a low amount of folic acid in the body. It is also used with 5-fluorouracil (5-FU) to treat colon cancer.  This medicine may be used for other purposes; ask your health care provider or pharmacist if you have questions.  What should I tell my health care provider before I take this medicine?  They need to know if you have any of these conditions:  -anemia from low levels of vitamin B-12 in the blood  -an unusual or allergic reaction to leucovorin, folic acid, other medicines,  foods, dyes, or preservatives  -pregnant or trying to get pregnant  -breast-feeding  How should I use this medicine?  This medicine is for injection into a muscle or into a vein. It is given by a health care professional in a hospital or clinic setting.  Talk to your pediatrician regarding the use of this medicine in children. Special care may be needed.  Overdosage: If you think you have taken too much of this medicine contact a poison control center or emergency room at once.  NOTE: This medicine is only for you. Do not share this medicine with others.  What if I miss a dose?  This does not apply.  What may interact with this medicine?  -capecitabine  -fluorouracil  -phenobarbital  -phenytoin  -primidone  -trimethoprim-sulfamethoxazole  This list may not describe all possible interactions. Give your health care provider a list of all the medicines, herbs, non-prescription drugs, or dietary supplements you use. Also tell them if you smoke, drink alcohol, or use illegal drugs. Some items may interact with your medicine.  What should I watch for while using this medicine?  Your condition will be monitored carefully while you are receiving this medicine.  This medicine may increase the side effects of 5-fluorouracil,

## 2016-09-06 ENCOUNTER — Ambulatory Visit
Admission: RE | Admit: 2016-09-06 | Discharge: 2016-09-06 | Disposition: A | Discharge status: Home / Self Care | Payer: 59 | Source: Ambulatory Visit | Attending: Vascular Surgery | Admitting: Vascular Surgery

## 2016-09-06 ENCOUNTER — Inpatient Hospital Stay: Payer: 59

## 2016-09-06 ENCOUNTER — Encounter
Admission: RE | Disposition: A | Discharge status: Home / Self Care | Payer: Self-pay | Source: Ambulatory Visit | Attending: Vascular Surgery

## 2016-09-06 DIAGNOSIS — Z8261 Family history of arthritis: Secondary | ICD-10-CM | POA: Diagnosis not present

## 2016-09-06 DIAGNOSIS — C250 Malignant neoplasm of head of pancreas: Secondary | ICD-10-CM | POA: Diagnosis not present

## 2016-09-06 DIAGNOSIS — M1990 Unspecified osteoarthritis, unspecified site: Secondary | ICD-10-CM | POA: Diagnosis not present

## 2016-09-06 DIAGNOSIS — C787 Secondary malignant neoplasm of liver and intrahepatic bile duct: Secondary | ICD-10-CM | POA: Diagnosis not present

## 2016-09-06 DIAGNOSIS — Z8042 Family history of malignant neoplasm of prostate: Secondary | ICD-10-CM | POA: Diagnosis not present

## 2016-09-06 DIAGNOSIS — Z841 Family history of disorders of kidney and ureter: Secondary | ICD-10-CM | POA: Diagnosis not present

## 2016-09-06 DIAGNOSIS — Z811 Family history of alcohol abuse and dependence: Secondary | ICD-10-CM | POA: Insufficient documentation

## 2016-09-06 DIAGNOSIS — Z880 Allergy status to penicillin: Secondary | ICD-10-CM | POA: Diagnosis not present

## 2016-09-06 DIAGNOSIS — Z85828 Personal history of other malignant neoplasm of skin: Secondary | ICD-10-CM | POA: Insufficient documentation

## 2016-09-06 DIAGNOSIS — Z9852 Vasectomy status: Secondary | ICD-10-CM | POA: Diagnosis not present

## 2016-09-06 DIAGNOSIS — Z8051 Family history of malignant neoplasm of kidney: Secondary | ICD-10-CM | POA: Diagnosis not present

## 2016-09-06 DIAGNOSIS — Z87891 Personal history of nicotine dependence: Secondary | ICD-10-CM | POA: Diagnosis not present

## 2016-09-06 HISTORY — PX: PERIPHERAL VASCULAR CATHETERIZATION: SHX172C

## 2016-09-06 LAB — CANCER ANTIGEN 19-9: CA 19 9: 808800 U/mL — AB (ref 0–35)

## 2016-09-06 SURGERY — PORTA CATH INSERTION
Anesthesia: Moderate Sedation

## 2016-09-06 MED ORDER — HYDROMORPHONE HCL 1 MG/ML IJ SOLN: 1.0000 mg | Freq: Once | INTRAMUSCULAR | Status: DC

## 2016-09-06 MED ORDER — FENTANYL CITRATE (PF) 100 MCG/2ML IJ SOLN
INTRAMUSCULAR | Status: DC | PRN
  Administered 2016-09-06 (×2): 50 ug via INTRAVENOUS

## 2016-09-06 MED ORDER — SODIUM CHLORIDE 0.9 % IR SOLN
Freq: Once | Status: DC
  Filled 2016-09-06: qty 2

## 2016-09-06 MED ORDER — FENTANYL CITRATE (PF) 100 MCG/2ML IJ SOLN
INTRAMUSCULAR | Status: AC
  Filled 2016-09-06: qty 2

## 2016-09-06 MED ORDER — SODIUM CHLORIDE 0.9 % IV SOLN
INTRAVENOUS | Status: DC
  Administered 2016-09-06: 13:00:00 via INTRAVENOUS

## 2016-09-06 MED ORDER — ONDANSETRON HCL 4 MG/2ML IJ SOLN: 4.0000 mg | Freq: Four times a day (QID) | INTRAMUSCULAR | Status: DC | PRN

## 2016-09-06 MED ORDER — HEPARIN SODIUM (PORCINE) 1000 UNIT/ML IJ SOLN
INTRAMUSCULAR | Status: AC
  Filled 2016-09-06: qty 1

## 2016-09-06 MED ORDER — CLINDAMYCIN PHOSPHATE 300 MG/50ML IV SOLN
300.0000 mg | Freq: Once | INTRAVENOUS | Status: AC
  Administered 2016-09-06: 300 mg via INTRAVENOUS

## 2016-09-06 MED ORDER — MIDAZOLAM HCL 5 MG/5ML IJ SOLN
INTRAMUSCULAR | Status: AC
  Filled 2016-09-06: qty 5

## 2016-09-06 MED ORDER — CLINDAMYCIN PHOSPHATE 300 MG/50ML IV SOLN
INTRAVENOUS | Status: AC
  Administered 2016-09-06: 300 mg via INTRAVENOUS
  Filled 2016-09-06: qty 50

## 2016-09-06 MED ORDER — LIDOCAINE-EPINEPHRINE (PF) 1 %-1:200000 IJ SOLN
INTRAMUSCULAR | Status: AC
  Filled 2016-09-06: qty 30

## 2016-09-06 MED ORDER — MIDAZOLAM HCL 2 MG/2ML IJ SOLN
INTRAMUSCULAR | Status: DC | PRN
  Administered 2016-09-06: 1 mg via INTRAVENOUS
  Administered 2016-09-06: 2 mg via INTRAVENOUS
  Administered 2016-09-06: 1 mg via INTRAVENOUS

## 2016-09-06 MED ORDER — HEPARIN (PORCINE) IN NACL 2-0.9 UNIT/ML-% IJ SOLN
INTRAMUSCULAR | Status: AC
  Filled 2016-09-06: qty 1000

## 2016-09-06 SURGICAL SUPPLY — 9 items
BAG DECANTER STRL (MISCELLANEOUS) ×3 IMPLANT
DRAPE INCISE IOBAN 66X45 STRL (DRAPES) ×3 IMPLANT
KIT PORT POWER 8FR ISP CVUE (Catheter) ×3 IMPLANT
PACK ANGIOGRAPHY (CUSTOM PROCEDURE TRAY) ×3 IMPLANT
PREP CHG 10.5 TEAL (MISCELLANEOUS) ×3 IMPLANT
SUT MNCRL AB 4-0 PS2 18 (SUTURE) ×3 IMPLANT
SUT PROLENE 0 CT 1 30 (SUTURE) ×3 IMPLANT
SUTURE VIC 3-0 (SUTURE) ×3 IMPLANT
TOWEL OR 17X26 4PK STRL BLUE (TOWEL DISPOSABLE) ×3 IMPLANT

## 2016-09-06 NOTE — H&P (Signed)
[facility] History & Physical Update  The patient was interviewed and re-examined.  The patient's previous History and Physical has been reviewed and is unchanged.  There is no change in the plan of care. We plan to proceed with the scheduled procedure.  Ruford Dudzinski, Dolores Lory, MD  09/06/2016, 1:28 PM

## 2016-09-06 NOTE — Discharge Instructions (Signed)
Implanted Port Insertion, Care After °Refer to this sheet in the next few weeks. These instructions provide you with information on caring for yourself after your procedure. Your health care provider may also give you more specific instructions. Your treatment has been planned according to current medical practices, but problems sometimes occur. Call your health care provider if you have any problems or questions after your procedure. °WHAT TO EXPECT AFTER THE PROCEDURE °After your procedure, it is typical to have the following:  °· Discomfort at the port insertion site. Ice packs to the area will help. °· Bruising on the skin over the port. This will subside in 3-4 days. °HOME CARE INSTRUCTIONS °· After your port is placed, you will get a manufacturer's information card. The card has information about your port. Keep this card with you at all times.   °· Know what kind of port you have. There are many types of ports available.   °· Wear a medical alert bracelet in case of an emergency. This can help alert health care workers that you have a port.   °· The port can stay in for as long as your health care provider believes it is necessary.   °· A home health care nurse may give medicines and take care of the port.   °· You or a family member can get special training and directions for giving medicine and taking care of the port at home.   °SEEK MEDICAL CARE IF:  °· Your port does not flush or you are unable to get a blood return.   °· You have a fever or chills. °SEEK IMMEDIATE MEDICAL CARE IF: °· You have new fluid or pus coming from your incision.   °· You notice a bad smell coming from your incision site.   °· You have swelling, pain, or more redness at the incision or port site.   °· You have chest pain or shortness of breath. °  °This information is not intended to replace advice given to you by your health care provider. Make sure you discuss any questions you have with your health care provider. °  °Document  Released: 09/25/2013 Document Revised: 12/10/2013 Document Reviewed: 09/25/2013 °Elsevier Interactive Patient Education ©2016 [employer] ° °

## 2016-09-06 NOTE — Op Note (Signed)
OPERATIVE NOTE   PROCEDURE: 1. Placement of a right IJ Infuse-a-Port  PRE-OPERATIVE DIAGNOSIS: Pancreatic CVA with metastases  POST-OPERATIVE DIAGNOSIS: Same  SURGEON: Katha Cabal M.D.  ANESTHESIA: Conscious sedation combined with 1% lidocaine with epinephrine  ESTIMATED BLOOD LOSS: Minimal   FINDING(S): 1.  Patent vein  SPECIMEN(S): None  INDICATIONS:   Tristan Clark is a 60 y.o. male who presents with metastatic pancreatic carcinoma. He therefore will undergo chemotherapy and needs appropriate intravenous access. The risks and benefits were reviewed for port insertion all questions answered patient has agreed to proceed.  DESCRIPTION: After obtaining full informed written consent, the patient was brought back to the special procedure suite and placed in the supine position. The patient's right neck and chest wall are prepped and draped in sterile fashion. Appropriate timeout was called.  Ultrasound is placed in a sterile sleeve, ultrasound is utilized to avoid vascular injury as well as secondary to lack of appropriate landmarks. The right internal jugular vein is identified. It is echolucent and homogeneous as well as easily compressible indicating patency. 1% lidocaine is infiltrated into the soft tissue at the base of the neck as well as on the chest wall.  Under direct ultrasound visualization Seldinger needle is inserted into the right internal jugular vein. J-wire is advanced under fluoroscopic guidance. A small counterincision was created at the wire insertion site. A transverse incision is created 2 fingerbreadths below the scapula and a pocket is fashioned using both blunt and sharp dissection. The pocket is tested for appropriate size with the hub of the Infuse-a-Port. The tunneling device is then used to pull the intravascular portion of the catheter from the pocket to the neck counterincision.  Dilator and peel-away sheath were then inserted over the wire and the  wire is removed. Catheter is then advanced into the venous system without difficulty. Peel-away sheath was then removed.  Catheter is then positioned under fluoroscopic guidance at the atrial caval junction. It is then transected connected to the hub and the hope is slipped into the subcutaneous pocket on the chest wall. The hub was then accessed percutaneously and aspirates easily and flushes well and is flushed with 30 cc of heparinized saline. The pocket incision is then closed in layers using interrupted 3-0 Vicryl for the subcutaneous tissues and 4-0 Monocryl subcuticular for skin closure. Dermabond is applied. The neck counterincision was closed with 4-0 Monocryl subcuticular and Dermabond as well.  The patient tolerated the procedure well and there were no immediate complications.  COMPLICATIONS: None  CONDITION: Unchanged  Katha Cabal M.D. [facility] Office: [phone]   09/06/2016, 2:16 PM

## 2016-09-07 ENCOUNTER — Inpatient Hospital Stay: Payer: 59

## 2016-09-07 ENCOUNTER — Inpatient Hospital Stay (HOSPITAL_BASED_OUTPATIENT_CLINIC_OR_DEPARTMENT_OTHER): Payer: 59 | Admitting: Internal Medicine

## 2016-09-07 ENCOUNTER — Ambulatory Visit: Payer: 59 | Admitting: Internal Medicine

## 2016-09-07 ENCOUNTER — Encounter: Payer: Self-pay | Admitting: Vascular Surgery

## 2016-09-07 ENCOUNTER — Ambulatory Visit: Payer: 59

## 2016-09-07 VITALS — BP 113/83 | HR 105 | Temp 98.8°F | Resp 18 | Ht 76.0 in | Wt 224.0 lb

## 2016-09-07 DIAGNOSIS — I82491 Acute embolism and thrombosis of other specified deep vein of right lower extremity: Secondary | ICD-10-CM

## 2016-09-07 DIAGNOSIS — R918 Other nonspecific abnormal finding of lung field: Secondary | ICD-10-CM

## 2016-09-07 DIAGNOSIS — C787 Secondary malignant neoplasm of liver and intrahepatic bile duct: Secondary | ICD-10-CM | POA: Diagnosis not present

## 2016-09-07 DIAGNOSIS — R634 Abnormal weight loss: Secondary | ICD-10-CM

## 2016-09-07 DIAGNOSIS — C250 Malignant neoplasm of head of pancreas: Secondary | ICD-10-CM

## 2016-09-07 DIAGNOSIS — R112 Nausea with vomiting, unspecified: Secondary | ICD-10-CM

## 2016-09-07 DIAGNOSIS — M1990 Unspecified osteoarthritis, unspecified site: Secondary | ICD-10-CM

## 2016-09-07 DIAGNOSIS — R1011 Right upper quadrant pain: Secondary | ICD-10-CM

## 2016-09-07 DIAGNOSIS — Z880 Allergy status to penicillin: Secondary | ICD-10-CM

## 2016-09-07 DIAGNOSIS — Z79899 Other long term (current) drug therapy: Secondary | ICD-10-CM

## 2016-09-07 DIAGNOSIS — Z87891 Personal history of nicotine dependence: Secondary | ICD-10-CM

## 2016-09-07 DIAGNOSIS — K831 Obstruction of bile duct: Secondary | ICD-10-CM

## 2016-09-07 DIAGNOSIS — R630 Anorexia: Secondary | ICD-10-CM

## 2016-09-07 DIAGNOSIS — Z7901 Long term (current) use of anticoagulants: Secondary | ICD-10-CM

## 2016-09-07 LAB — COMPREHENSIVE METABOLIC PANEL
ALK PHOS: 329 U/L — AB (ref 38–126)
ALT: 125 U/L — AB (ref 17–63)
AST: 126 U/L — ABNORMAL HIGH (ref 15–41)
Albumin: 2.3 g/dL — ABNORMAL LOW (ref 3.5–5.0)
Anion gap: 9 (ref 5–15)
BUN: 21 mg/dL — ABNORMAL HIGH (ref 6–20)
CALCIUM: 8.1 mg/dL — AB (ref 8.9–10.3)
CO2: 22 mmol/L (ref 22–32)
CREATININE: 0.63 mg/dL (ref 0.61–1.24)
Chloride: 100 mmol/L — ABNORMAL LOW (ref 101–111)
Glucose, Bld: 184 mg/dL — ABNORMAL HIGH (ref 65–99)
Potassium: 4.1 mmol/L (ref 3.5–5.1)
Sodium: 131 mmol/L — ABNORMAL LOW (ref 135–145)
Total Bilirubin: 8.2 mg/dL — ABNORMAL HIGH (ref 0.3–1.2)
Total Protein: 6.1 g/dL — ABNORMAL LOW (ref 6.5–8.1)

## 2016-09-07 LAB — CBC WITH DIFFERENTIAL/PLATELET
Basophils Absolute: 0.1 10*3/uL (ref 0–0.1)
Basophils Relative: 1 %
EOS PCT: 1 %
Eosinophils Absolute: 0.2 10*3/uL (ref 0–0.7)
HCT: 34.2 % — ABNORMAL LOW (ref 40.0–52.0)
HEMOGLOBIN: 11.6 g/dL — AB (ref 13.0–18.0)
LYMPHS ABS: 0.8 10*3/uL — AB (ref 1.0–3.6)
LYMPHS PCT: 5 %
MCH: 28.7 pg (ref 26.0–34.0)
MCHC: 34 g/dL (ref 32.0–36.0)
MCV: 84.3 fL (ref 80.0–100.0)
Monocytes Absolute: 1.3 10*3/uL — ABNORMAL HIGH (ref 0.2–1.0)
Monocytes Relative: 8 %
NEUTROS PCT: 85 %
Neutro Abs: 14.5 10*3/uL — ABNORMAL HIGH (ref 1.4–6.5)
Platelets: 225 10*3/uL (ref 150–440)
RBC: 4.06 MIL/uL — AB (ref 4.40–5.90)
RDW: 14.2 % (ref 11.5–14.5)
WBC: 16.8 10*3/uL — AB (ref 3.8–10.6)

## 2016-09-07 MED ORDER — HEPARIN SOD (PORK) LOCK FLUSH 100 UNIT/ML IV SOLN
INTRAVENOUS | Status: AC
  Filled 2016-09-07: qty 5

## 2016-09-07 MED ORDER — CIPROFLOXACIN HCL 500 MG PO TABS: 500.0000 mg | ORAL_TABLET | Freq: Two times a day (BID) | ORAL | 0 refills | Status: AC

## 2016-09-07 MED ORDER — HEPARIN SOD (PORK) LOCK FLUSH 100 UNIT/ML IV SOLN
500.0000 [IU] | Freq: Once | INTRAVENOUS | Status: AC | PRN
  Administered 2016-09-07: 500 [IU]

## 2016-09-07 NOTE — Assessment & Plan Note (Addendum)
#   Metastatic pancreas cancer to liver; hold FOLFIRINOX cycle #1 today [C discussion below]  # Obstructive jaundice status post ERCP at [facility] on September 18- bilirubin today's 8/LFTs stable from a week ago. White count elevated at 16-continue ciprofloxacin for 5 more days. I left a message for Dr. Harl Bowie; GI [facility] to discuss the above concerns.  # Right lower extremity DVT on Lovenox-symptoms improving  # Will recheck LFTs on  9/22nd- and is improving start first cycle of chemotherapy.   # follow up with me in 1 week/labs/ IVFs.

## 2016-09-07 NOTE — Progress Notes (Signed)
[place] NOTE  Patient Care Team: Margo Common, PA as PCP - General (Physician Assistant) Clent Jacks, RN as Registered Nurse  CHIEF COMPLAINTS/PURPOSE OF CONSULTATION:  Pancreatic cancer  #  Oncology History   # SEP 2017- PANCREATIC ADENO CA [s/p liver bx]; PET- pan head mass; multiple liver lesions.   # Sep 20th FOLFIRINOX  # ERCP/stenting [sep 18th; [facility]]; Sep 14th- R LE DVT [on lovenox 100 BID]     Primary cancer of head of pancreas ([place])   09/01/2016 Initial Diagnosis    Primary cancer of head of pancreas ([facility])       HISTORY OF PRESENTING ILLNESS:  Tristan Clark 60 y.o.  male newly Diagnosed metastatic pancreatic cancer to the liver- is here to proceed with cycle #1 of chemotherapy.  In the interim patient was noted to have elevated bilirubin up to 5/ elevated LFTs suggestive of obstruction- underwent ERCP at [facility] status post and placement.  In the interim patient was also found to have a DVT of the right lower extremity currently on Lovenox.  Complains of poor appetite. Continued weight loss. Notes easy nausea vomiting. No abdominal pain. He did notice to be jaundiced.   ROS: A complete 10 point review of system is done which is negative except mentioned above in history of present illness  MEDICAL HISTORY:  Past Medical History:  Diagnosis Date  . Arthritis   . Cancer [facility])    skin    SURGICAL HISTORY: Past Surgical History:  Procedure Laterality Date  . COLONOSCOPY  2009   Normal; unsure of MD  . PERIPHERAL VASCULAR CATHETERIZATION N/A 09/06/2016   Procedure: Glori Luis Cath Insertion;  Surgeon: Katha Cabal, MD;  Location: [facility];  Service: Cardiovascular;  Laterality: N/A;  . VASECTOMY  2001    SOCIAL HISTORY: carpenter; quit smoking; mod alcohol hard liqor; elon.. Lives with wife..  Social History   Social History  . Marital status: Married    Spouse name: N/A  . Number of children: 2  . Years of  education: N/A   Occupational History  . Carpenter    Social History Main Topics  . Smoking status: Former Smoker    Quit date: 08/29/1993  . Smokeless tobacco: Never Used  . Alcohol use 0.0 oz/week     Comment: occasional use;  2-3 times a week drinks a Bourbon  . Drug use: No  . Sexual activity: Not on file   Other Topics Concern  . Not on file   Social History Narrative  . No narrative on file    FAMILY HISTORY: limited; grandpa- mat- in 60s; colon cancer;  Family History  Problem Relation Age of Onset  . Arthritis Sister   . Nephrolithiasis Sister   . Alcohol abuse Brother   . Cancer Maternal Grandmother   . Kidney cancer Neg Hx   . Prostate cancer Neg Hx     ALLERGIES:  is allergic to amoxicillin.  MEDICATIONS:  Current Outpatient Prescriptions  Medication Sig Dispense Refill  . ciprofloxacin (CIPRO) 500 MG tablet Take 1 tablet (500 mg total) by mouth 2 (two) times daily. 10 tablet 0  . enoxaparin (LOVENOX) 100 MG/ML injection Inject 1 mL (100 mg total) into the skin every 12 (twelve) hours. 120 Syringe 0  . fentaNYL (DURAGESIC - DOSED MCG/HR) 25 MCG/HR patch Place 1 patch (25 mcg total) onto the skin every 3 (three) days. 5 patch 0  . lidocaine-prilocaine (EMLA) cream Apply to  affected area once 30 g 3  . omeprazole (PRILOSEC) 40 MG capsule Take by mouth.    . oxyCODONE (OXY IR/ROXICODONE) 5 MG immediate release tablet Take 1 tablet (5 mg total) by mouth every 6 (six) hours as needed for severe pain. 60 tablet 0  . EPINEPHrine (EPIPEN 2-PAK) 0.3 mg/0.3 mL IJ SOAJ injection Inject 1 Dose as directed as needed. For severe allergic reations    . loperamide (IMODIUM A-D) 2 MG tablet Take 1 tablet (2 mg total) by mouth 4 (four) times daily as needed. Take 2 at diarrhea onset , then 1 every 2hr until 12hrs with no BM. May take 2 every 4hrs at night. If diarrhea recurs repeat. (Patient not taking: Reported on 09/07/2016) 100 tablet 1  . loratadine (CLARITIN) 10 MG tablet  Take 1 tablet by mouth daily.    . ondansetron (ZOFRAN) 8 MG tablet Take 1 tablet (8 mg total) by mouth every 8 (eight) hours as needed for refractory nausea / vomiting. Start on day 3 after chemotherapy. (Patient not taking: Reported on 09/07/2016) 40 tablet 1  . prochlorperazine (COMPAZINE) 10 MG tablet Take 1 tablet (10 mg total) by mouth every 6 (six) hours as needed (NAUSEA). (Patient not taking: Reported on 09/07/2016) 30 tablet 1  . valACYclovir (VALTREX) 1000 MG tablet TAKE 1 TABLET BY MOUTH TWICE A DAY AS NEEDED (Patient not taking: Reported on 09/07/2016) 30 tablet 3   No current facility-administered medications for this visit.       Marland Kitchen  PHYSICAL EXAMINATION: ECOG PERFORMANCE STATUS: 1 - Symptomatic but completely ambulatory  Vitals:   09/07/16 0843  BP: 113/83  Pulse: (!) 105  Resp: 18  Temp: 98.8 F (37.1 C)   Filed Weights   09/07/16 0843  Weight: 224 lb (101.6 kg)    GENERAL: Well-nourished well-developed; Alert, no distress and comfortable.  With his wife.  EYES: Positive for icterus. OROPHARYNX: no thrush or ulceration; good dentition  NECK: supple, no masses felt LYMPH:  no palpable lymphadenopathy in the cervical, axillary or inguinal regions LUNGS: clear to auscultation and  No wheeze or crackles HEART/CVS: regular rate & rhythm and no murmurs; No lower extremity edema ABDOMEN: abdomen soft, non-tender and normal bowel sounds Musculoskeletal:no cyanosis of digits and no clubbing  PSYCH: alert & oriented x 3 with fluent speech NEURO: no focal motor/sensory deficits SKIN:  no rashes or significant lesions  LABORATORY DATA:  I have reviewed the data as listed Lab Results  Component Value Date   WBC 16.8 (H) 09/07/2016   HGB 11.6 (L) 09/07/2016   HCT 34.2 (L) 09/07/2016   MCV 84.3 09/07/2016   PLT 225 09/07/2016    Recent Labs  07/29/16 0931 08/11/16 1429 09/01/16 1203 09/07/16 0827  NA 143 142 133* 131*  K 4.6 3.9 3.7 4.1  CL 103 102 101 100*   CO2 24 23 22 22   GLUCOSE 121* 95 178* 184*  BUN 13 13 16  21*  CREATININE 0.98 0.90 0.72 0.63  CALCIUM 9.4 9.0 8.4* 8.1*  GFRNONAA 84 93 >60 >60  GFRAA 97 108 >60 >60  PROT 6.6  --  6.8 6.1*  ALBUMIN 4.3  --  3.1* 2.3*  AST 29  --  103* 126*  ALT 33  --  112* 125*  ALKPHOS 129*  --  331* 329*  BILITOT 0.7  --  4.0* 8.2*    RADIOGRAPHIC STUDIES: I have personally reviewed the radiological images as listed and agreed with the findings in the  report. Nm Pet Image Initial (pi) Skull Base To Thigh  Result Date: 08/16/2016 CLINICAL DATA:  Initial treatment strategy for hepatic lesion. EXAM: NUCLEAR MEDICINE PET SKULL BASE TO THIGH TECHNIQUE: 12.7 mCi F-18 FDG was injected intravenously. Full-ring PET imaging was performed from the skull base to thigh after the radiotracer. CT data was obtained and used for attenuation correction and anatomic localization. FASTING BLOOD GLUCOSE:  Value: 118 mg/dl COMPARISON:  CT without contrast 08/02/2016 FINDINGS: NECK No hypermetabolic lymph nodes in the neck. CHEST The are 2 small nodules at the lung bases with no associated metabolic activity measuring 3 mm in RIGHT lower lobe on image 132 and 3 mm LEFT lower lobe on image 139. This focus of hypermetabolic activity at the LEFT lung base just above the diaphragm SUV max equal 2.2. No clear parenchymal lung lesion identified. This may represent misregistration with hepatic metastasis. ABDOMEN/PELVIS There are multiple foci of intense metabolic activity associated with rounded hypodense lesions within the LEFT a nd RIGHT hepatic lobe. For example lesion in the caudate lobe SUV max equal 8.4. Hypermetabolic lesion in the anterior LEFT hepatic lobe with SUV max equal 10.9. There approximately a 12 lesions in the liver of varying sizes. There is a hypermetabolic focus within the pancreatic head with SUV max equals 7.3. There is a rounded fullness measuring 2.7 in this the location but difficult to define on noncontrast  CT. There is a hypermetabolic peripancreatic lymph node dorsal to the pancreas measuring 11 mm with SUV max equal 8.4. The hypermetabolic periaortic lymph node between the IVC and the aorta on image 187 fused data set. No abnormal metabolic activity associated:  . SKELETON No focal hypermetabolic activity to suggest skeletal metastasis. IMPRESSION: 1. Multiple hypermetabolic lesions within LEFT and RIGHT hepatic lobe consistent with hepatic metastasis. 2. High suspicion for PANCREATIC ADENOCARCINOMA with hypermetabolic lesion at the head of the pancreas. Difficult define a true lesion on noncontrast CT. No duct dilatation. Consider hepatic biopsy to confirm versus endoscopic ultrasound. 3. Peripancreatic and periaortic hypermetabolic metastatic lymph nodes. 4. No evidence of colorectal primary carcinoma. 5. Small nodules of the lung bases without metabolic activity. These nodules are below accurate PET characterization. Recommend attention on follow-up. Electronically Signed   By: Suzy Bouchard M.D.   On: 08/16/2016 13:11   US Biopsy  Result Date: 08/29/2016 INDICATION: 60 year old male with a history of pancreatic lesion and possible pancreatic adenocarcinoma metastases. EXAM: ULTRASOUND BIOPSY CORE LIVER MEDICATIONS: None. ANESTHESIA/SEDATION: Moderate (conscious) sedation was employed during this procedure. A total of Versed 0.25 mg and Fentanyl 75 mcg was administered intravenously. Moderate Sedation Time: 20 minutes. The patient's level of consciousness and vital signs were monitored continuously by radiology nursing throughout the procedure under my direct supervision. FLUOROSCOPY TIME:  None COMPLICATIONS: None PROCEDURE: Informed written consent was obtained from the patient after a thorough discussion of the procedural risks, benefits and alternatives. All questions were addressed. Maximal Sterile Barrier Technique was utilized including caps, mask, sterile gowns, sterile gloves, sterile drape, hand  hygiene and skin antiseptic. A timeout was performed prior to the initiation of the procedure. Ultrasound survey of the abdomen was performed with images stored and sent to PACs. The patient is prepped and draped in the usual sterile fashion. The skin and subcutaneous tissues overlying the right upper quadrant were generously infiltrated 1% lidocaine for local anesthesia. Small stab incision was made, and with ultrasound guidance, 17 gauge guide needle was advanced into heterogeneously echoic/hypoechoic lesion of the segment 6 liver. Multiple  core biopsy were acquired. Samples placed into formalin. Two Gel-Foam pledgets were infused. Needle was removed and a final image was stored. Patient tolerated the procedure well and remained hemodynamically stable throughout. No complications were encountered and no significant blood loss. IMPRESSION: Status post ultrasound-guided biopsy of right liver lobe lesion, segment 6, with heterogeneously echoic characteristics. Tissue specimen sent to pathology for complete histopathologic analysis. Signed, Dulcy Fanny. Earleen Newport, DO [facility] Electronically Signed   By: Corrie Mckusick D.O.   On: 08/29/2016 10:16   US Venous Img Lower Unilateral Right  Result Date: 09/01/2016 CLINICAL DATA:  Pain and swelling in the right lower extremity for 1.5 weeks. EXAM: RIGHT LOWER EXTREMITY VENOUS DOPPLER ULTRASOUND TECHNIQUE: Gray-scale sonography with graded compression, as well as color Doppler and duplex ultrasound, were performed to evaluate the deep venous system from the level of the common femoral vein through the popliteal and proximal calf veins. Spectral Doppler was utilized to evaluate flow at rest and with distal augmentation maneuvers. COMPARISON:  None. FINDINGS: Left common femoral vein is compressible without thrombus. Echogenic thrombus in the distal right femoral vein. The distal femoral vein is not compressible. There  is also echogenic thrombus in the right popliteal vein. Right posterior tibial veins do not compress and appear to be thrombosed. Questionable thrombus in the right peroneal veins. Minimal flow in the distal right femoral vein and right popliteal vein. Right common femoral vein is patent. The right saphenofemoral junction is patent. Right profunda femoral vein is patent without thrombus. Proximal and mid right femoral vein are patent. IMPRESSION: Positive for deep venous thrombosis in the right lower extremity. There is clot involving right calf veins, right popliteal vein and the distal right femoral vein. These results will be called to the ordering clinician or representative by the Radiologist Assistant, and communication documented in the PACS or zVision Dashboard. Electronically Signed   By: Markus Daft M.D.   On: 09/01/2016 12:41    ASSESSMENT & PLAN:   Primary cancer of head of pancreas ([place]) # Metastatic pancreas cancer to liver; hold FOLFIRINOX cycle #1 today [C discussion below]  # Obstructive jaundice status post ERCP at [facility] on September 18- bilirubin today's 8/LFTs stable from a week ago. White count elevated at 16-continue ciprofloxacin for 5 more days. I left a message for Dr. Harl Bowie; GI [facility] to discuss the above concerns.  # Right lower extremity DVT on Lovenox-symptoms improving  # Will recheck LFTs on  9/22nd- and is improving start first cycle of chemotherapy.   # follow up with me in 1 week/labs/ IVFs.   All questions were answered. The patient knows to call the clinic with any problems, questions or concerns.    Cammie Sickle, MD 09/07/2016 5:21 PM

## 2016-09-07 NOTE — Progress Notes (Signed)
Bil. Stent placed on Monday this week @ [facility]

## 2016-09-08 ENCOUNTER — Ambulatory Visit: Payer: Self-pay | Admitting: Gastroenterology

## 2016-09-09 ENCOUNTER — Inpatient Hospital Stay: Payer: 59

## 2016-09-09 VITALS — BP 129/81 | HR 98 | Temp 97.8°F | Resp 20

## 2016-09-09 DIAGNOSIS — C250 Malignant neoplasm of head of pancreas: Secondary | ICD-10-CM | POA: Diagnosis not present

## 2016-09-09 DIAGNOSIS — C801 Malignant (primary) neoplasm, unspecified: Secondary | ICD-10-CM

## 2016-09-09 LAB — COMPREHENSIVE METABOLIC PANEL
ALBUMIN: 2 g/dL — AB (ref 3.5–5.0)
ALT: 120 U/L — ABNORMAL HIGH (ref 17–63)
AST: 148 U/L — AB (ref 15–41)
Alkaline Phosphatase: 347 U/L — ABNORMAL HIGH (ref 38–126)
Anion gap: 10 (ref 5–15)
BUN: 22 mg/dL — AB (ref 6–20)
CHLORIDE: 100 mmol/L — AB (ref 101–111)
CO2: 23 mmol/L (ref 22–32)
Calcium: 8.1 mg/dL — ABNORMAL LOW (ref 8.9–10.3)
Creatinine, Ser: 0.7 mg/dL (ref 0.61–1.24)
GFR calc Af Amer: >60 mL/min (ref >60)
GFR calc non Af Amer: >60 mL/min (ref >60)
GLUCOSE: 226 mg/dL — AB (ref 65–99)
POTASSIUM: 4.1 mmol/L (ref 3.5–5.1)
SODIUM: 133 mmol/L — AB (ref 135–145)
Total Bilirubin: 11.2 mg/dL — ABNORMAL HIGH (ref 0.3–1.2)
Total Protein: 5.8 g/dL — ABNORMAL LOW (ref 6.5–8.1)

## 2016-09-09 LAB — CBC WITH DIFFERENTIAL/PLATELET
BASOS ABS: 0.1 10*3/uL (ref 0–0.1)
BASOS PCT: 1 %
EOS ABS: 0.1 10*3/uL (ref 0–0.7)
EOS PCT: 1 %
HEMATOCRIT: 33.2 % — AB (ref 40.0–52.0)
Hemoglobin: 11.1 g/dL — ABNORMAL LOW (ref 13.0–18.0)
Lymphocytes Relative: 2 %
Lymphs Abs: 0.4 10*3/uL — ABNORMAL LOW (ref 1.0–3.6)
MCH: 28.2 pg (ref 26.0–34.0)
MCHC: 33.6 g/dL (ref 32.0–36.0)
MCV: 84 fL (ref 80.0–100.0)
MONO ABS: 1.3 10*3/uL — AB (ref 0.2–1.0)
Monocytes Relative: 7 %
NEUTROS ABS: 16.4 10*3/uL — AB (ref 1.4–6.5)
Neutrophils Relative %: 89 %
PLATELETS: 247 10*3/uL (ref 150–440)
RBC: 3.95 MIL/uL — ABNORMAL LOW (ref 4.40–5.90)
RDW: 14.9 % — AB (ref 11.5–14.5)
WBC: 18.3 10*3/uL — ABNORMAL HIGH (ref 3.8–10.6)

## 2016-09-09 MED ORDER — SODIUM CHLORIDE 0.9% FLUSH
10.0000 mL | Freq: Once | INTRAVENOUS | Status: AC
  Administered 2016-09-09: 10 mL via INTRAVENOUS
  Filled 2016-09-09: qty 10

## 2016-09-09 MED ORDER — HEPARIN SOD (PORK) LOCK FLUSH 100 UNIT/ML IV SOLN
500.0000 [IU] | Freq: Once | INTRAVENOUS | Status: AC
  Administered 2016-09-09: 500 [IU] via INTRAVENOUS

## 2016-09-12 ENCOUNTER — Inpatient Hospital Stay: Payer: 59

## 2016-09-13 ENCOUNTER — Other Ambulatory Visit: Payer: Self-pay | Admitting: *Deleted

## 2016-09-13 DIAGNOSIS — C250 Malignant neoplasm of head of pancreas: Secondary | ICD-10-CM

## 2016-09-14 ENCOUNTER — Inpatient Hospital Stay: Payer: 59

## 2016-09-14 ENCOUNTER — Telehealth: Payer: Self-pay | Admitting: *Deleted

## 2016-09-14 NOTE — Telephone Encounter (Signed)
md made aware.

## 2016-09-14 NOTE — Telephone Encounter (Signed)
-----   Message from Wallene Dales sent at 09/14/2016  9:29 AM EDT ----- Regarding: Pt Hospitalized at [facility] in ICU Pt's family called and cancelled lab appt for today. He is currently a inpatient at [facility] in the ICU.

## 2016-09-16 ENCOUNTER — Inpatient Hospital Stay: Payer: 59 | Admitting: Internal Medicine

## 2016-09-16 ENCOUNTER — Inpatient Hospital Stay: Payer: 59

## 2016-09-19 ENCOUNTER — Inpatient Hospital Stay: Payer: 59

## 2016-10-19 DEATH — deceased

## 2016-10-20 IMAGING — CR DG CERVICAL SPINE COMPLETE 4+V
1 series · 7 of 7 positions shown · non-contrast
Comparison: None.

CLINICAL DATA: 59-year-old male with neck pain radiating to the
left upper extremity for 2 weeks, no known injury. Initial
encounter.

EXAM:
CERVICAL SPINE  4+ VIEWS

[Series 1: dg cervical spine complete · 0.14mm/px · 7 of 7 slices shown]
[im 1/7]
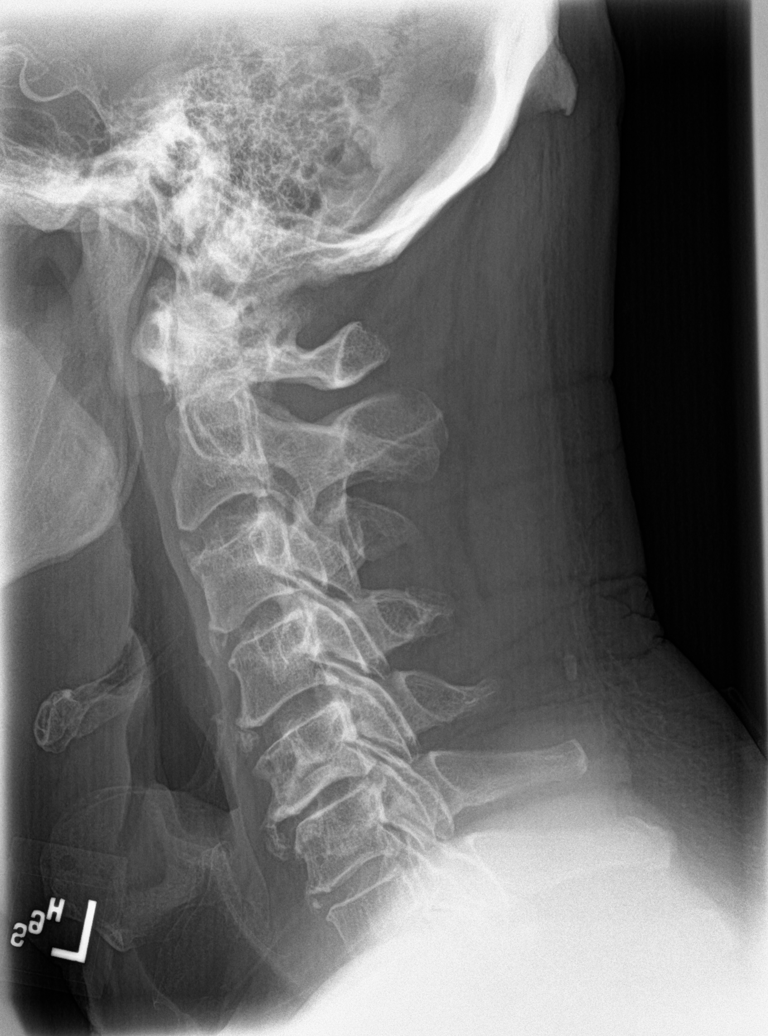
[im 2/7]
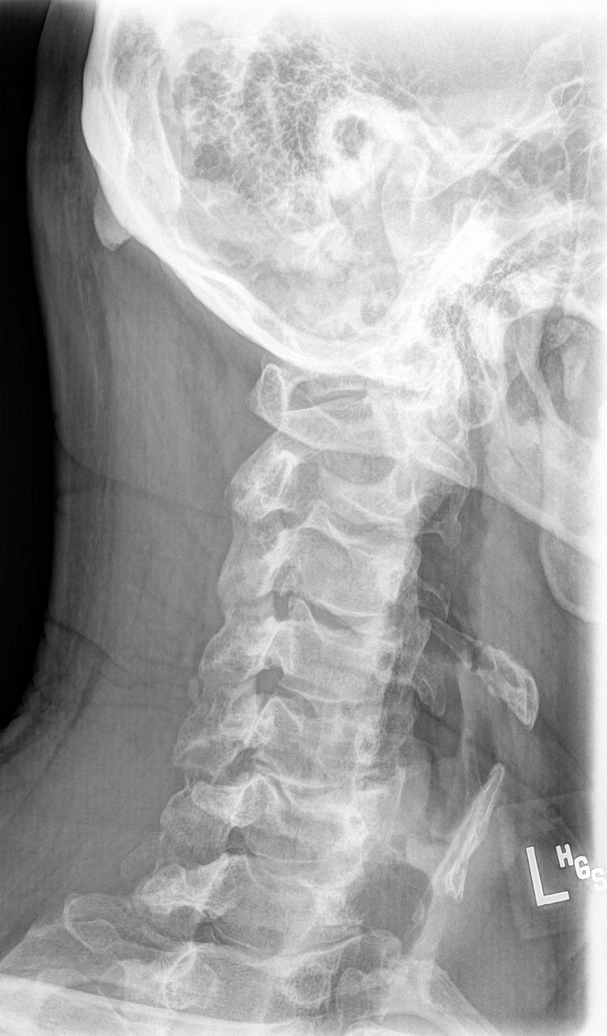
[im 3/7]
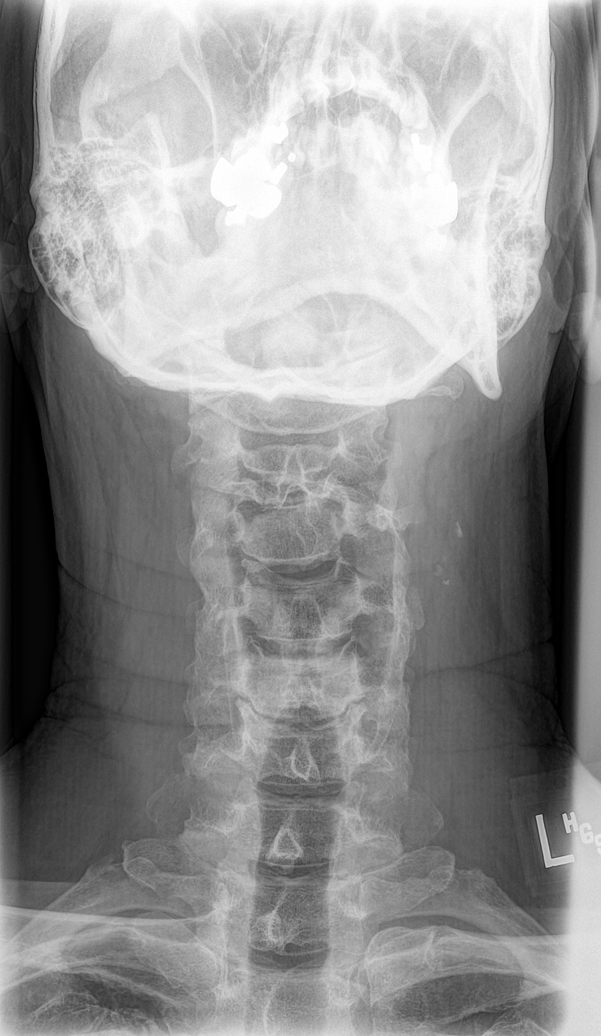
[im 4/7]
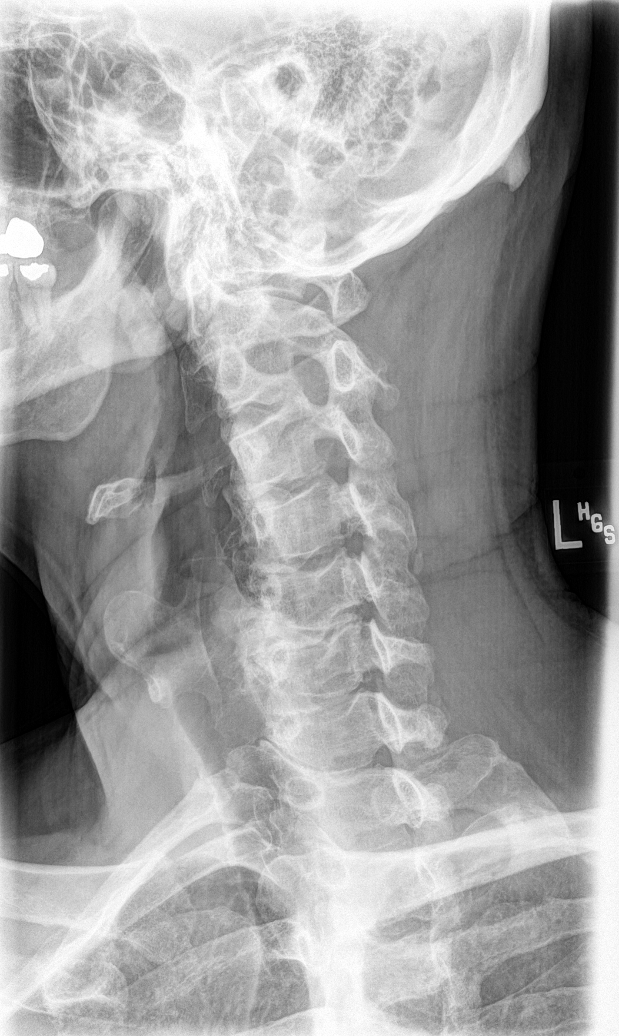
[im 5/7]
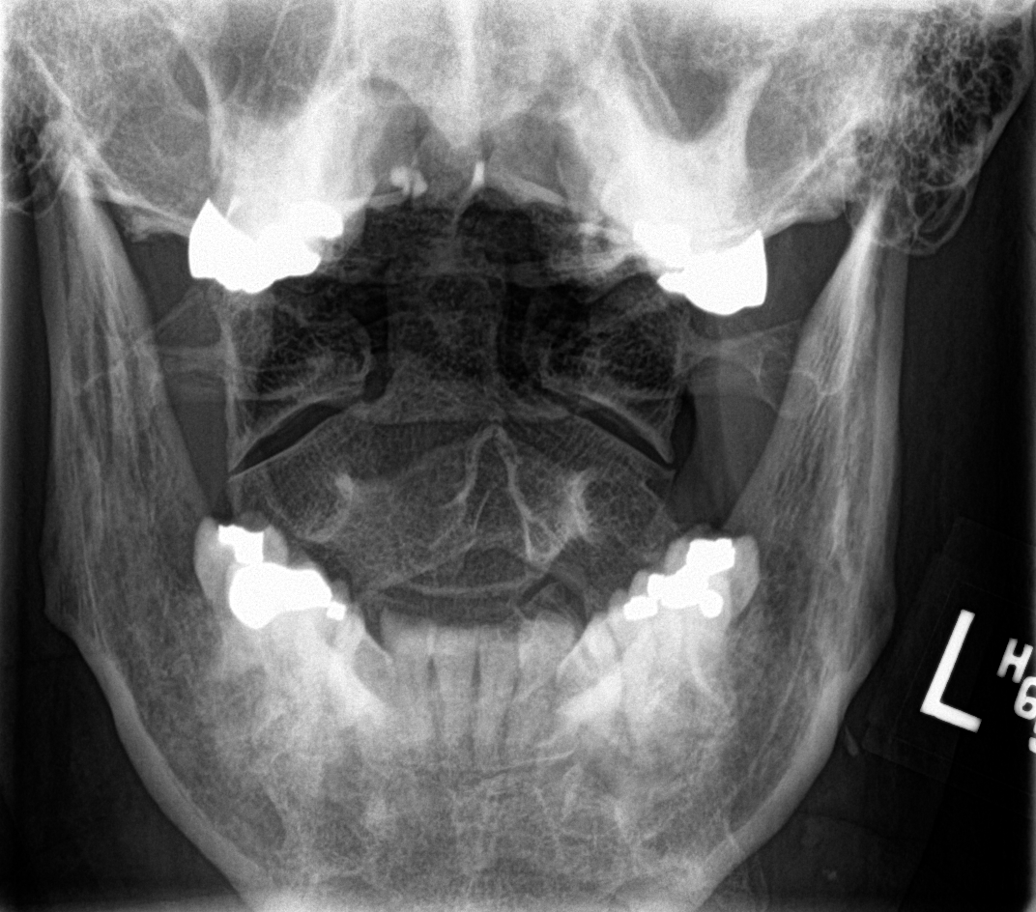
[im 6/7]
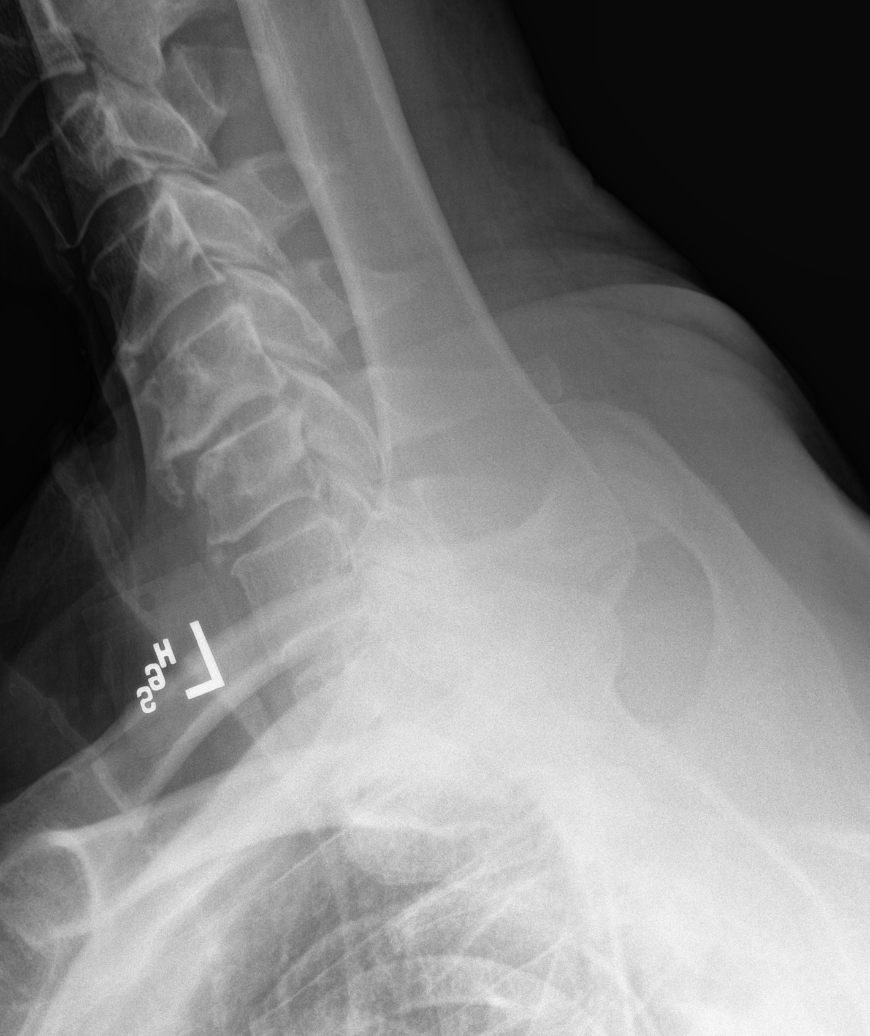
[im 7/7]
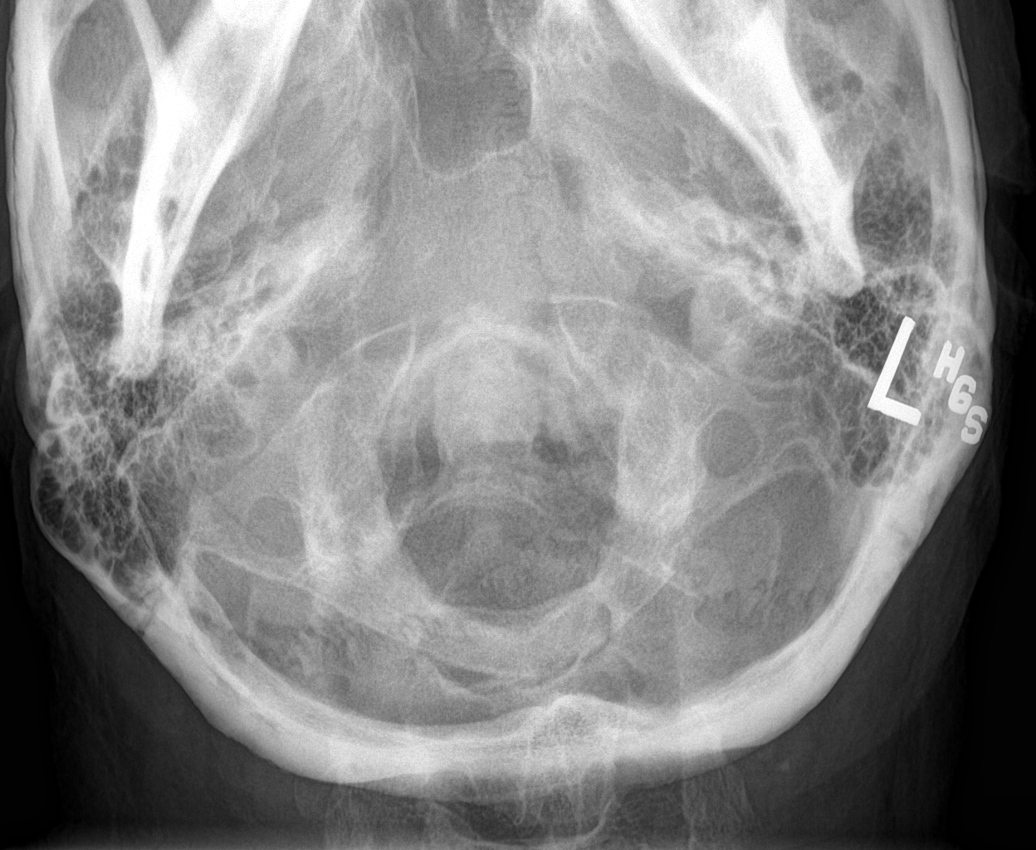

[7 of 7 positions shown; findings below may reference images not displayed]

FINDINGS: Straightening of cervical lordosis. Prevertebral soft tissue contour
within normal limits. Multilevel degenerative endplate changes with
various degrees of endplate spurring, maximal at C5-C6 were there is
associated disc space loss. Bilateral posterior element alignment is
within normal limits. AP alignment and lung apices within normal
limits. Left cervical carotid calcified atherosclerosis. C1-C2
alignment and odontoid within normal limits. Cervicothoracic
junction alignment is within normal limits.
IMPRESSION: 1. Chronic disc and endplate degeneration at C5-C6. Chronic endplate
degeneration elsewhere. No acute osseous abnormality identified.
2. Calcified atherosclerosis of the left carotid.

## 2016-11-26 ENCOUNTER — Other Ambulatory Visit: Payer: Self-pay | Admitting: Nurse Practitioner

## 2016-12-19 HISTORY — PX: COLONOSCOPY: SHX174

## 2017-10-02 IMAGING — CT CT ABD-PELV W/O CM
2 of 4 series · 15 of 46 positions shown, 17 images · non-contrast
Comparison: None.

CLINICAL DATA: Right flank pain for 5 days, micro hematuria, no
history of kidney stone

EXAM:
CT ABDOMEN AND PELVIS WITHOUT CONTRAST
TECHNIQUE: Multidetector CT imaging of the abdomen and pelvis was performed
following the standard protocol without IV contrast.

[Series 2: soft tissue · axial · 0.83mm/px · z∈[-992,-518]mm · 12 of 105 slices shown, 14 images]
[im 5/105  soft-tissue]
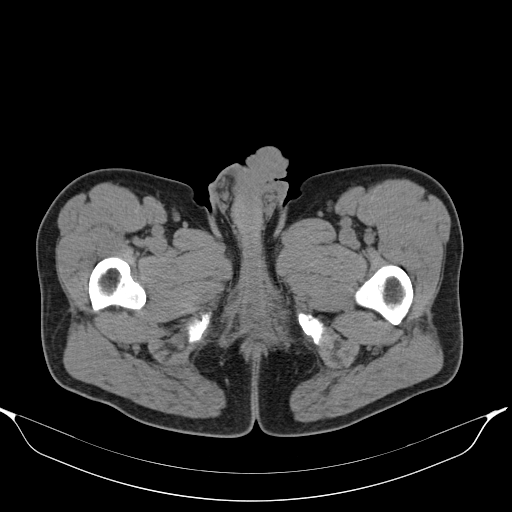
[im 5/105  bone]
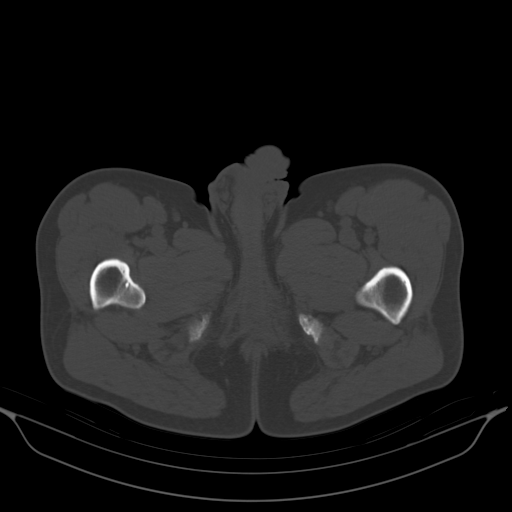
[im 14/105  soft-tissue]
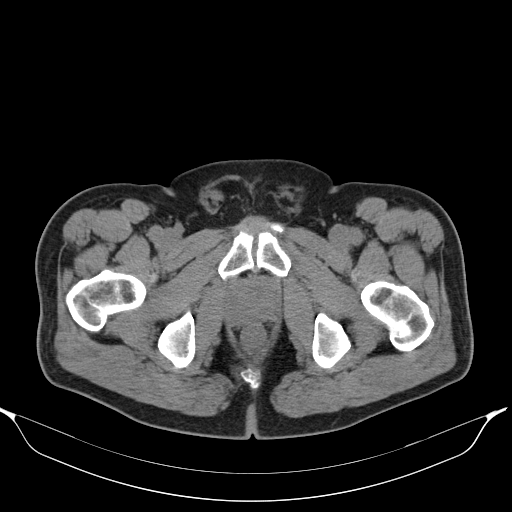
[im 23/105  soft-tissue]
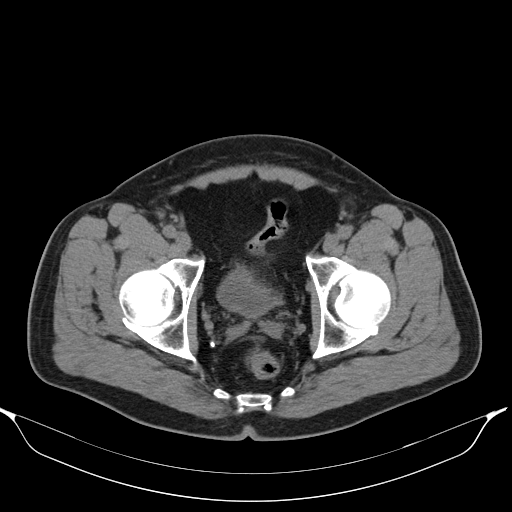
[im 32/105  soft-tissue]
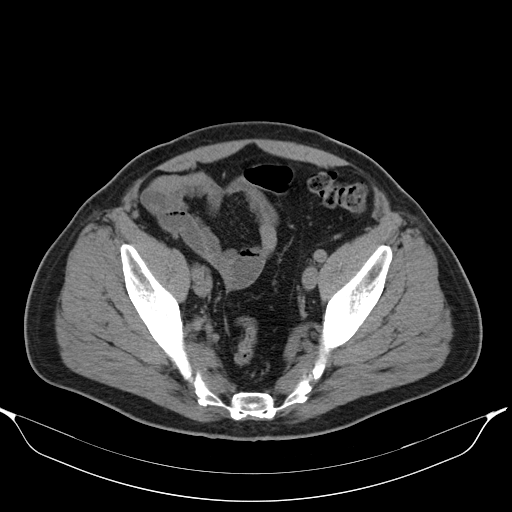
[im 41/105  soft-tissue]
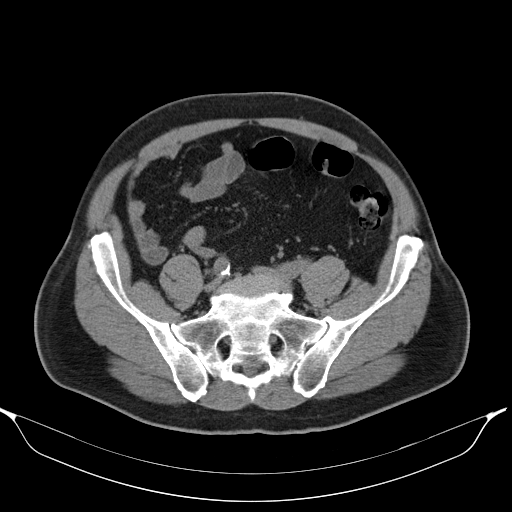
[im 50/105  soft-tissue]
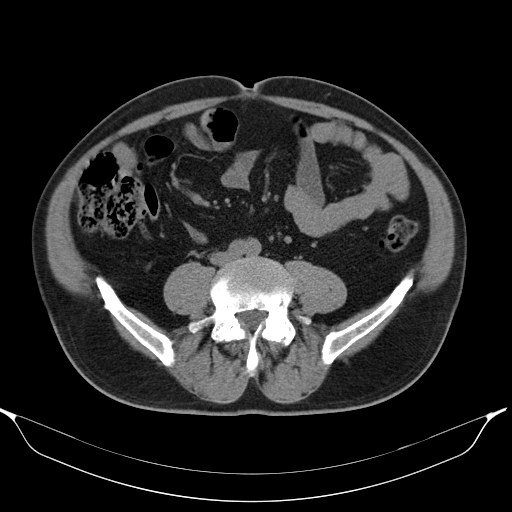
[im 55/105  soft-tissue]
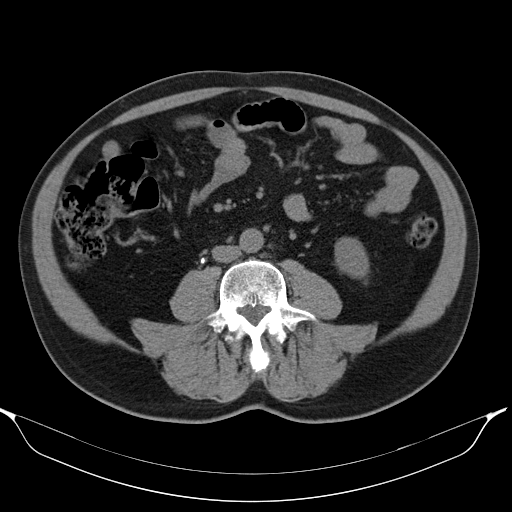
[im 64/105  soft-tissue]
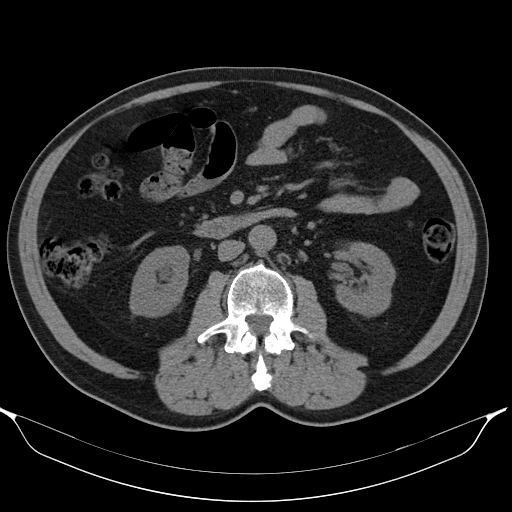
[im 73/105  soft-tissue]
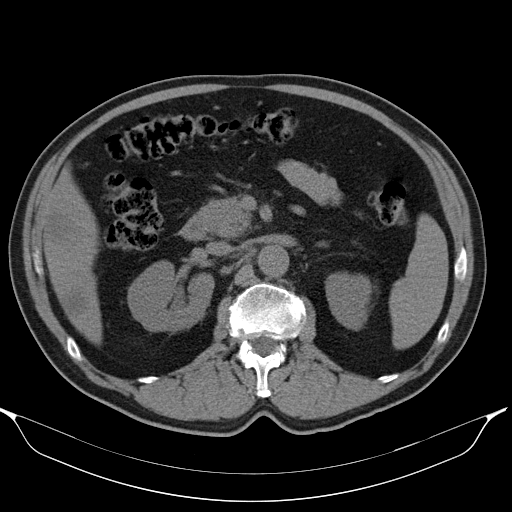
[im 73/105  bone]
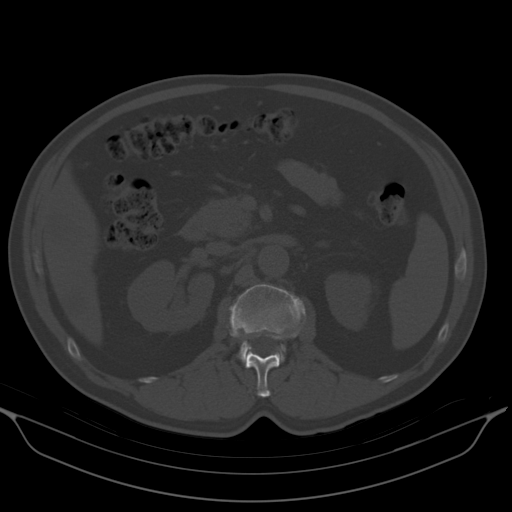
[im 82/105  soft-tissue]
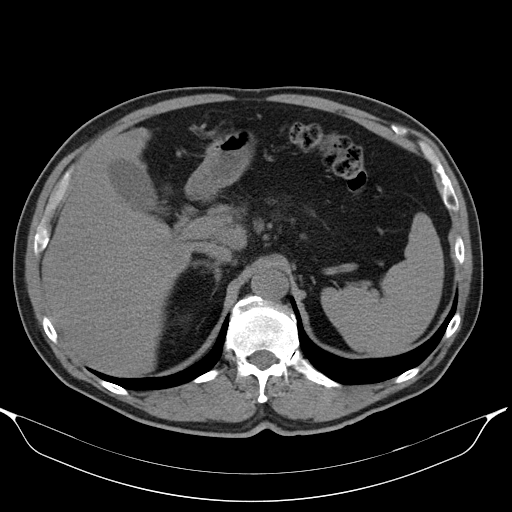
[im 91/105  soft-tissue]
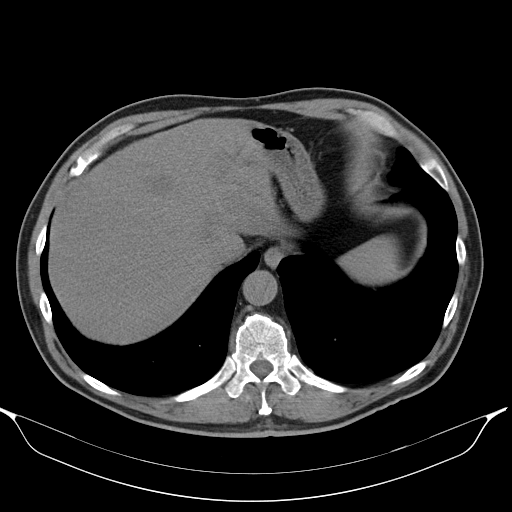
[im 100/105  soft-tissue]
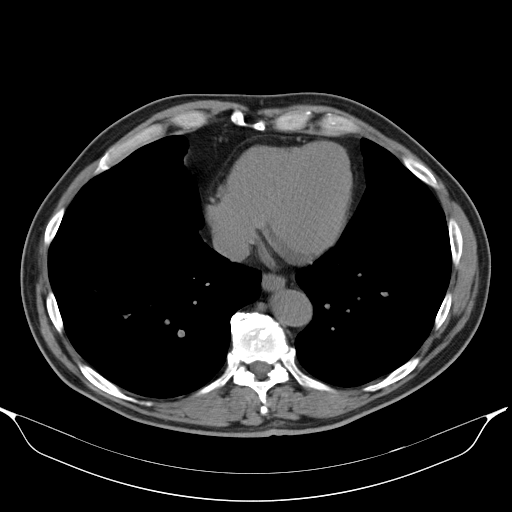

[Series 602: coronal · coronal · 1.03mm/px · 3 of 129 slices shown]
[im 43/129  soft-tissue]
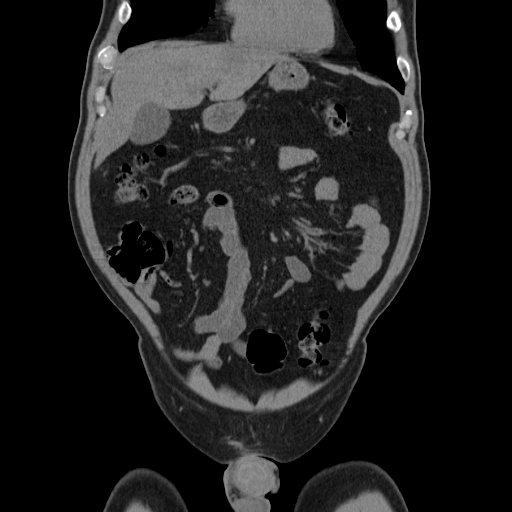
[im 57/129  soft-tissue]
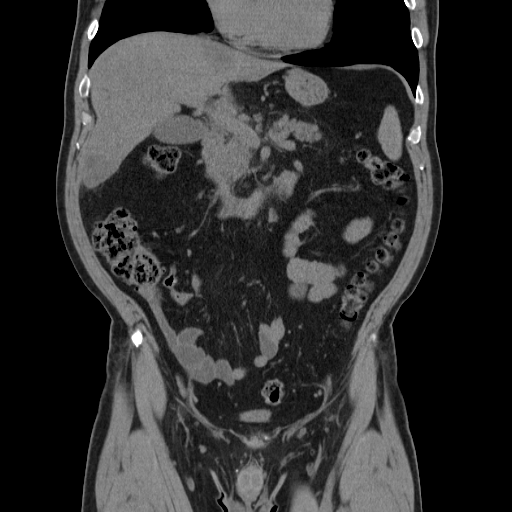
[im 72/129  soft-tissue]
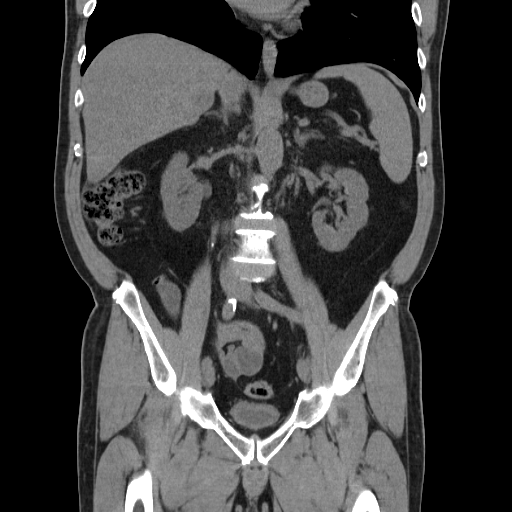

[15 of 46 positions shown; findings below may reference images not displayed]

FINDINGS: On lung window images, there are a few lung nodules which are
noncalcified bilaterally. A 3 mm nodule is present in the posterior
left lower lobe on image 18 series 4. A questionable 4 mm nodule is
noted anteriorly in the left lower lobe on image 13. Also, a
triangular 4 mm nodule is present pleural-based in the right lower
lobe on image 12 No follow-up needed if patient is low-risk (and has
no known or suspected primary neoplasm). Non-contrast chest CT can
be considered in 12 months if patient is high-risk. This
recommendation follows the consensus statement: Guidelines for
Management of Incidental Pulmonary Nodules Detected on CT
Images:From the [HOSPITAL] 7897; published online before
print (10.1148/radiol.4582868627).

There are multiple poorly defined low-attenuation structures of
varying sizes scattered throughout the liver very suspicious for
metastatic involvement of the liver. One of the larger lesions is in
the caudal right lobe of liver measuring 4.1 cm in diameter on image
35. No ductal dilatation is seen. No calcified gallstones are noted.
The pancreas is normal in size and the pancreatic duct is not
dilated. The adrenal glands and spleen are unremarkable. The stomach
is not well distended. A lymph node posterior to the pancreatic head
on image 29 measures 11 mm in short axis diameter. There does appear
to be mild right hydronephrosis present. A 3 mm nonobstructing
calculus is noted in the lower pole of the right kidney. No left
renal calculi are seen. However, the right ureter is slightly
prominent to point of low-grade obstruction caused by a 5 mm
proximal right ureteral calculus. The proximal left ureter is normal
in caliber. Mild atherosclerotic change of the abdominal aorta is
noted. Only small retroperitoneal and mesenteric lymph nodes are
present.

The distal ureters are normal in caliber and no distal ureteral
calculus is seen. The urinary bladder is not well distended and
therefore is somewhat difficult to assess. The prostate is somewhat
prominent measuring 4.5 x 4.9 cm. No definite colonic lesion is seen
although portions of the colon are decompressed. The terminal ileum
and the appendix are unremarkable

The lumbar vertebrae are in normal alignment with degenerative disc
disease at L5-S1. No lytic or blastic bone lesion is seen.
IMPRESSION: 1. Multiple low-attenuation liver lesions of varying size is
worrisome for either metastatic involvement of the liver poor
multicentric hepatocellular carcinoma. Chest x-ray is recommended,
and PET-CT may be helpful to assess for primary malignancy.
2. Multiple noncalcified lung nodules through the lung bases none
larger than 4 mm in diameter. General followup recommendations are
given above, but in view of the liver lesions described above, chest
x-ray is recommended to exclude primary malignancy.
3. 5 mm proximal right ureteral calculus causing mild right
hydronephrosis. 3 mm nonobstructing right lower pole renal calculus.
4. Mild atherosclerotic change of the abdominal aorta.

## 2017-10-29 IMAGING — US US BIOPSY
1 series · 10 of 10 positions shown · non-contrast
Comparison: none

INDICATION: 60-year-old male with a history of pancreatic lesion and possible
pancreatic adenocarcinoma metastases.

[Series 1: us biopsy · 0.16mm/px · 10 of 10 slices shown]
[im 1/10]
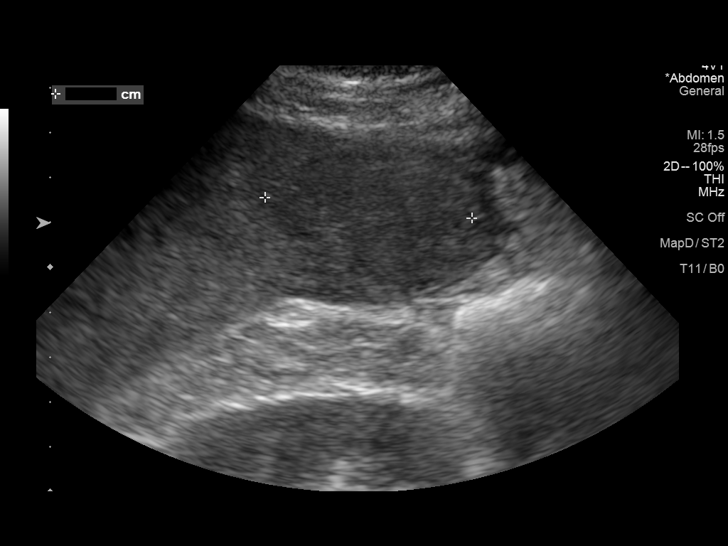
[im 2/10]
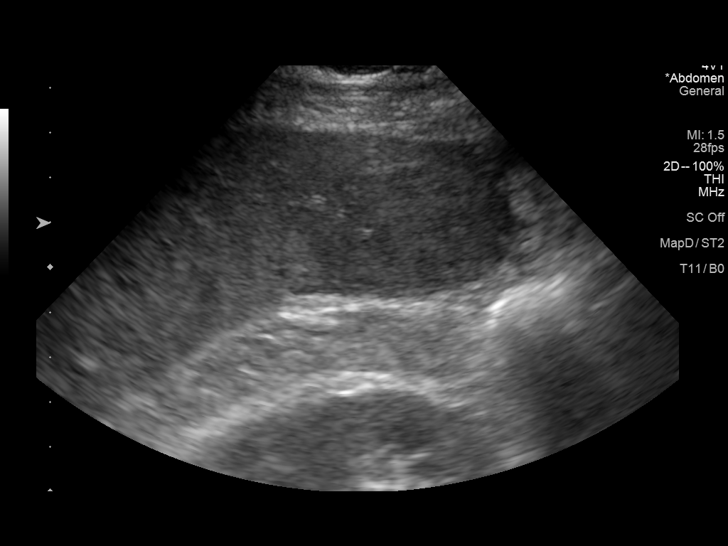
[im 3/10]
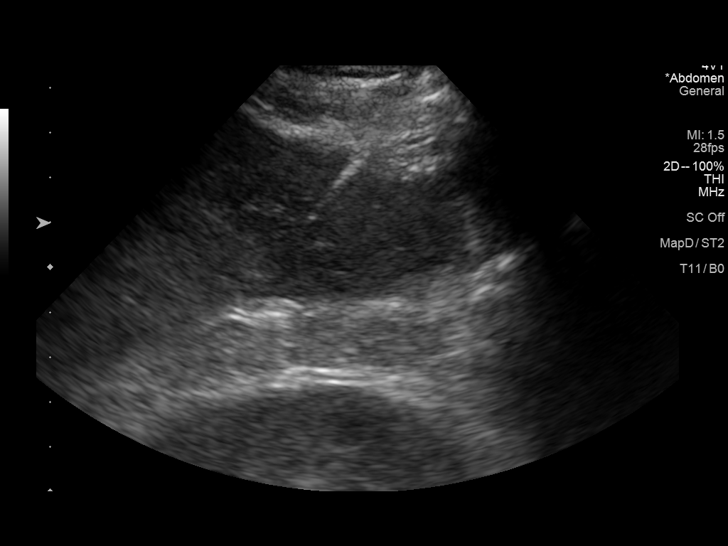
[im 4/10]
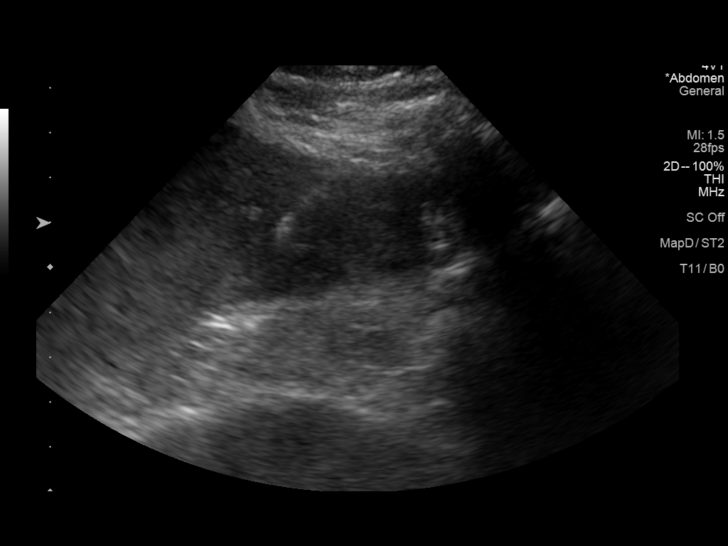
[im 5/10]
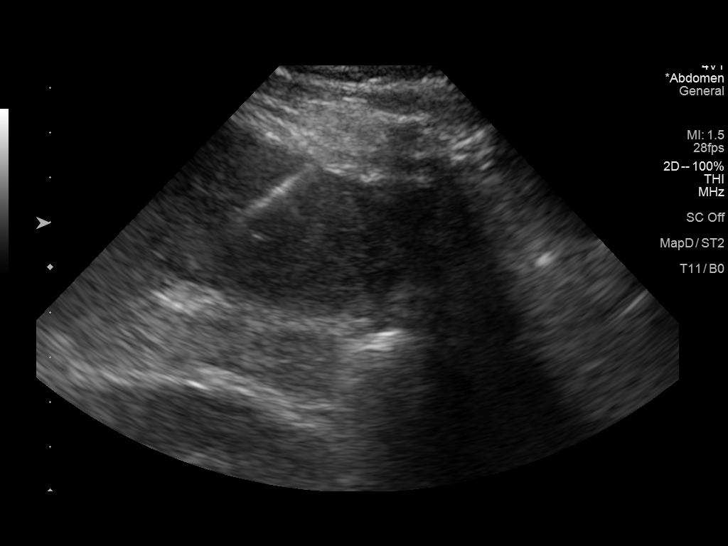
[im 6/10]
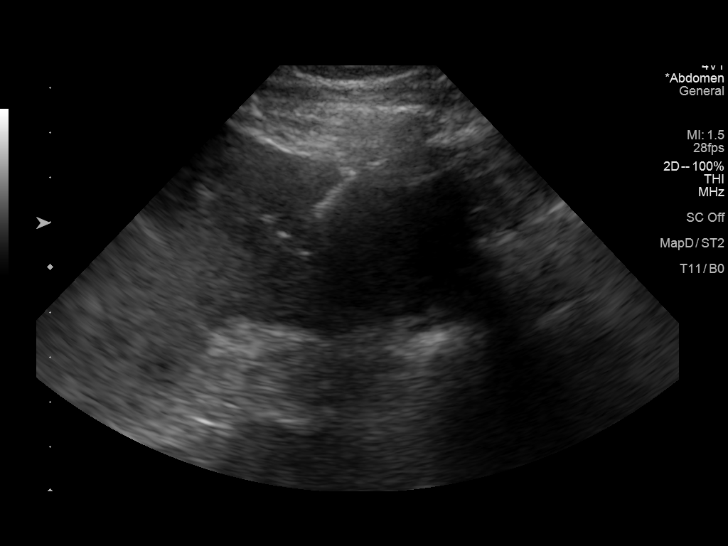
[im 7/10]
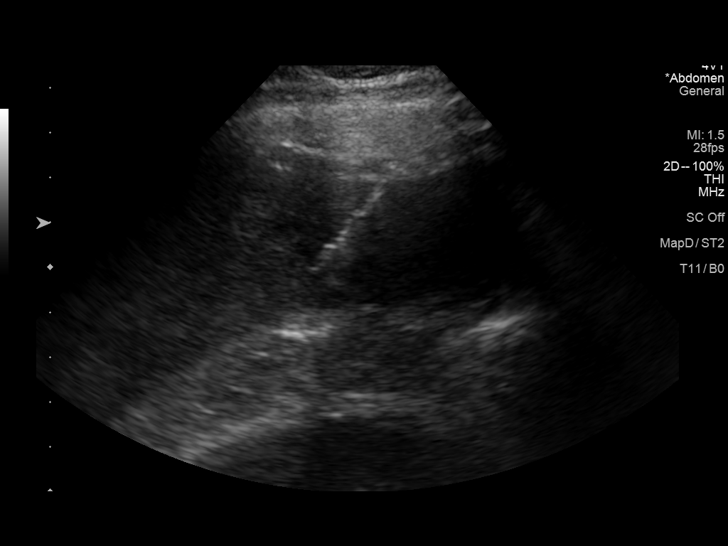
[im 8/10]
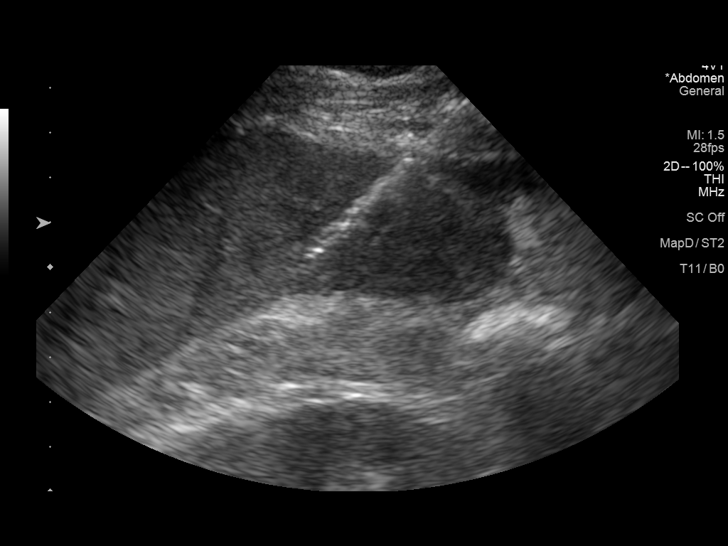
[im 9/10]
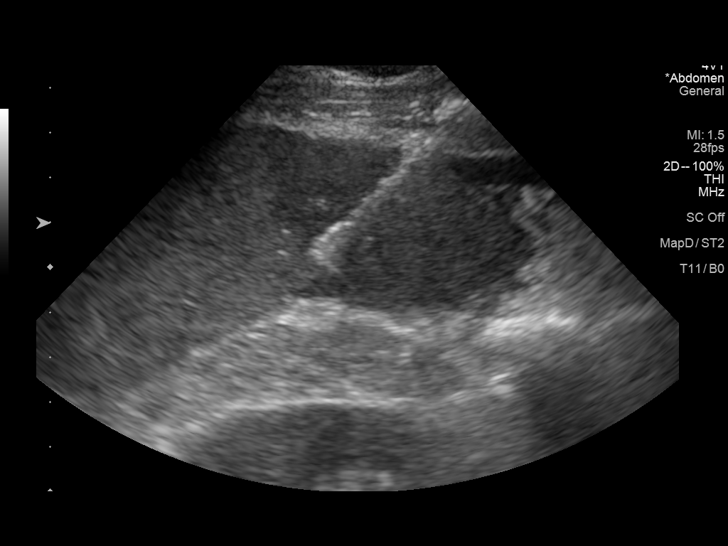
[im 10/10]
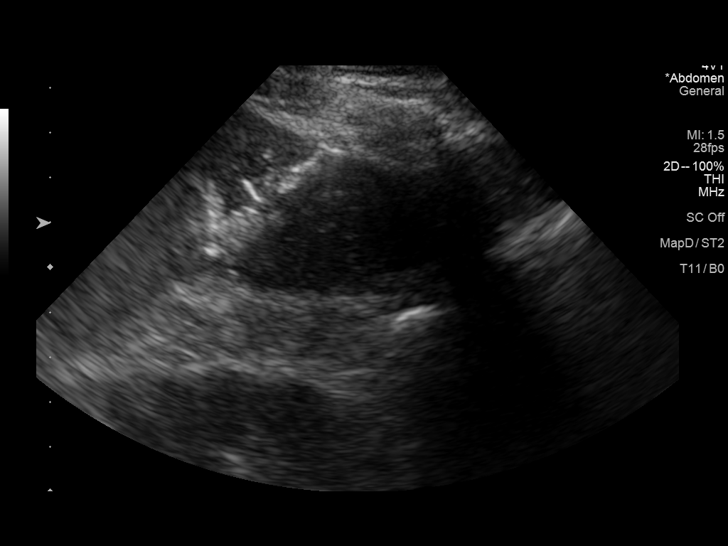

[10 of 10 positions shown; findings below may reference images not displayed]

EXAM:
ULTRASOUND BIOPSY CORE LIVER

MEDICATIONS:
None.

ANESTHESIA/SEDATION:
Moderate (conscious) sedation was employed during this procedure. A
total of Versed 0.25 mg and Fentanyl 75 mcg was administered
intravenously.

Moderate Sedation Time: 20 minutes. The patient's level of
consciousness and vital signs were monitored continuously by
radiology nursing throughout the procedure under my direct
supervision.

FLUOROSCOPY TIME:  None

COMPLICATIONS:
None

PROCEDURE:
Informed written consent was obtained from the patient after a
thorough discussion of the procedural risks, benefits and
alternatives. All questions were addressed. Maximal Sterile Barrier
Technique was utilized including caps, mask, sterile gowns, sterile
gloves, sterile drape, hand hygiene and skin antiseptic. A timeout
was performed prior to the initiation of the procedure.

Ultrasound survey of the abdomen was performed with images stored
and sent to PACs.

The patient is prepped and draped in the usual sterile fashion. The
skin and subcutaneous tissues overlying the right upper quadrant
were generously infiltrated 1% lidocaine for local anesthesia. Small
stab incision was made, and with ultrasound guidance, 17 gauge guide
needle was advanced into heterogeneously echoic/hypoechoic lesion of
the segment 6 liver. Multiple core biopsy were acquired. Samples
placed into formalin.

Two Gel-Foam pledgets were infused. Needle was removed and a final
image was stored.

Patient tolerated the procedure well and remained hemodynamically
stable throughout.

No complications were encountered and no significant blood loss.
IMPRESSION: Status post ultrasound-guided biopsy of right liver lobe lesion,
segment 6, with heterogeneously echoic characteristics. Tissue
specimen sent to pathology for complete histopathologic analysis.

## 2017-11-01 IMAGING — US US EXTREM LOW VENOUS*R*
1 series · 15 of 24 positions shown · non-contrast
Comparison: None.

CLINICAL DATA: Pain and swelling in the right lower extremity for
1.5 weeks.

EXAM:
RIGHT LOWER EXTREMITY VENOUS DOPPLER ULTRASOUND
TECHNIQUE: Gray-scale sonography with graded compression, as well as color
Doppler and duplex ultrasound, were performed to evaluate the deep
venous system from the level of the common femoral vein through the
popliteal and proximal calf veins. Spectral Doppler was utilized to
evaluate flow at rest and with distal augmentation maneuvers.

[Series 1: us extrem low venous*right* · 15 of 36 slices shown]
[im 1/36]
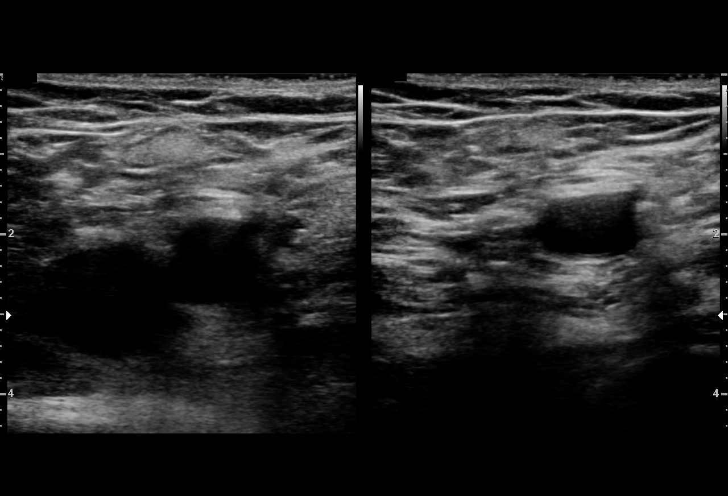
[im 4/36]
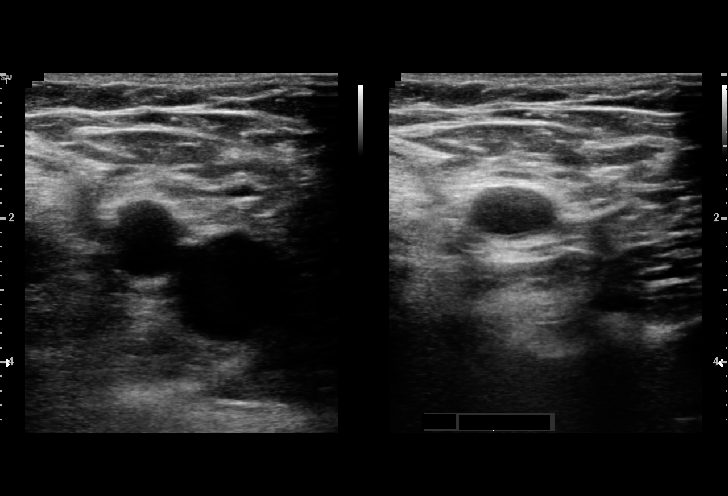
[im 7/36]
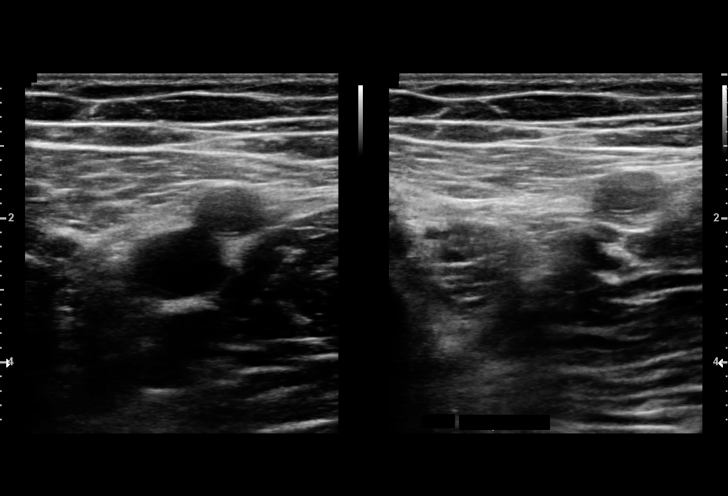
[im 8/36]
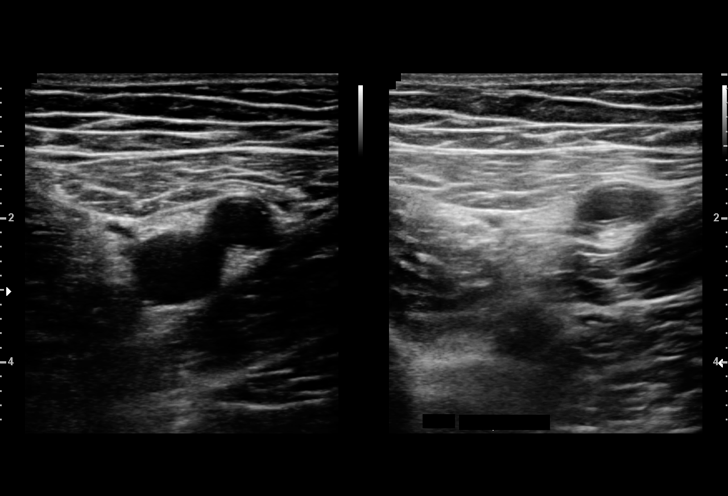
[im 11/36]
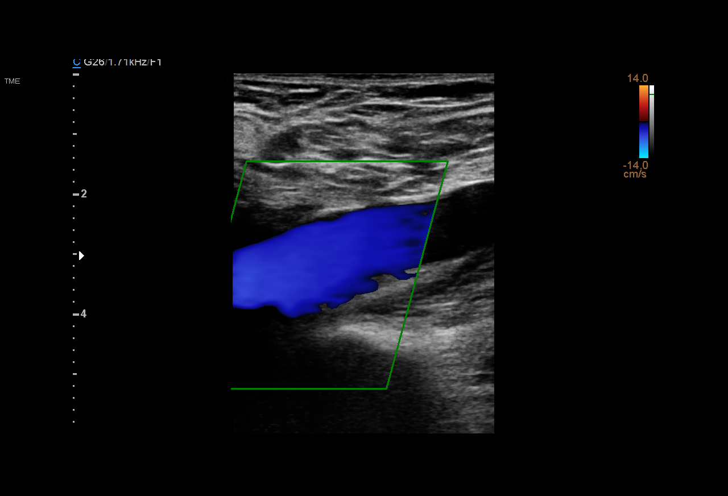
[im 13/36]
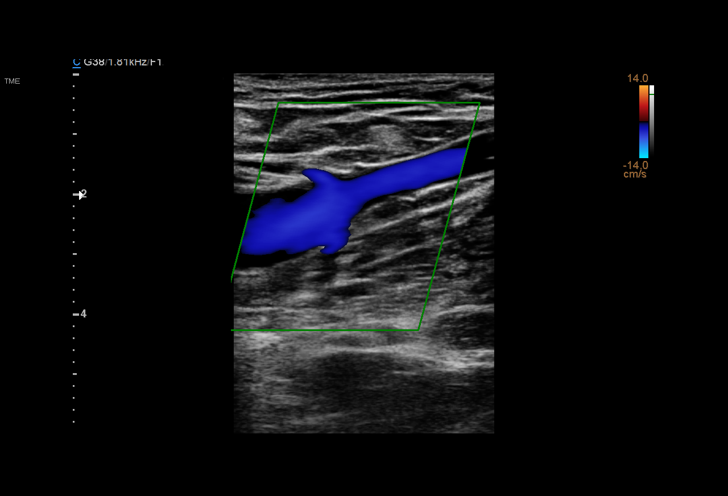
[im 16/36]
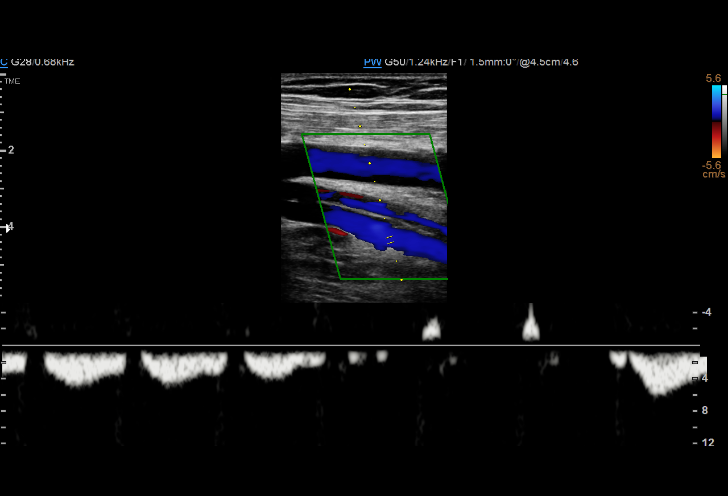
[im 19/36]
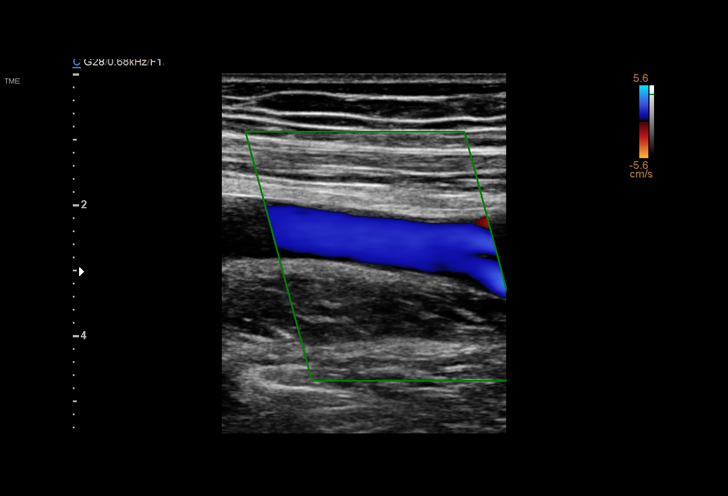
[im 20/36]
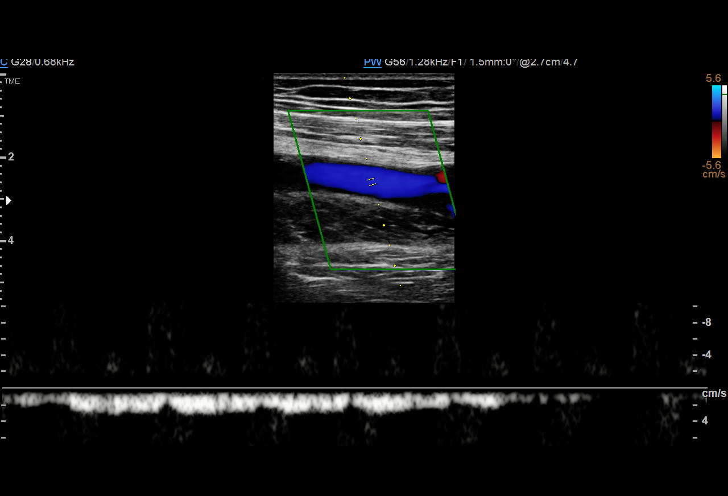
[im 23/36]
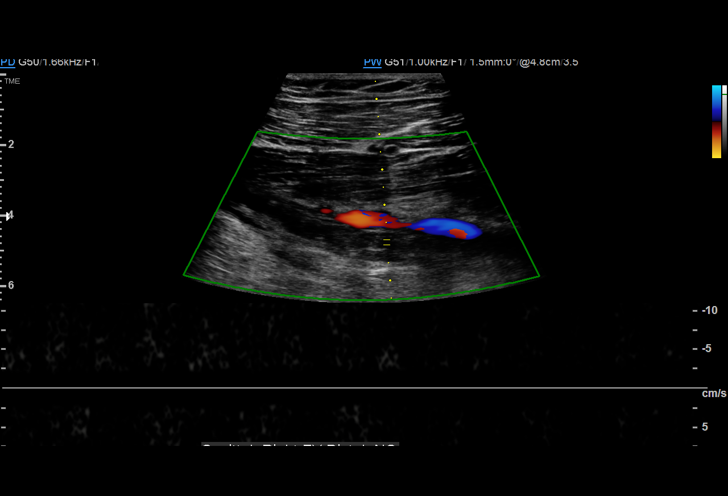
[im 25/36]
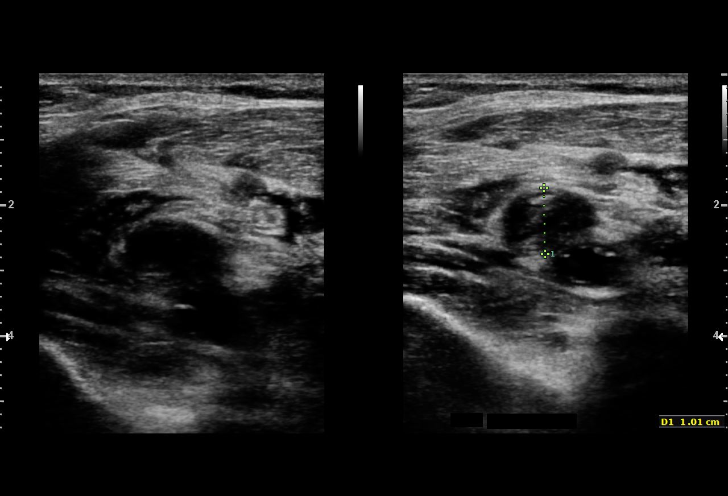
[im 28/36]
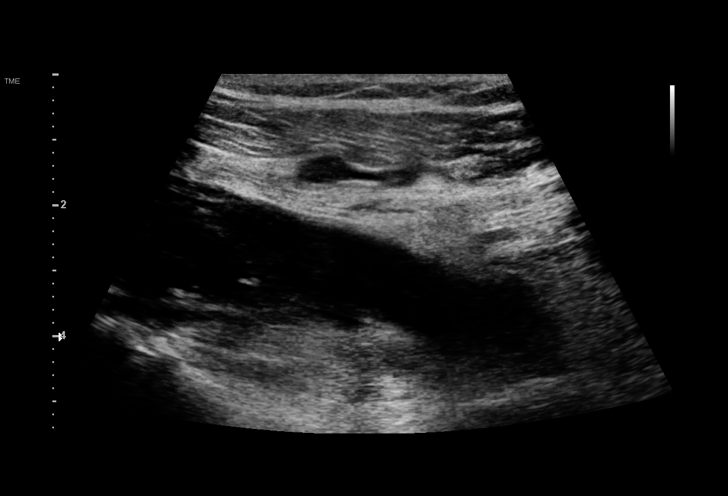
[im 31/36]
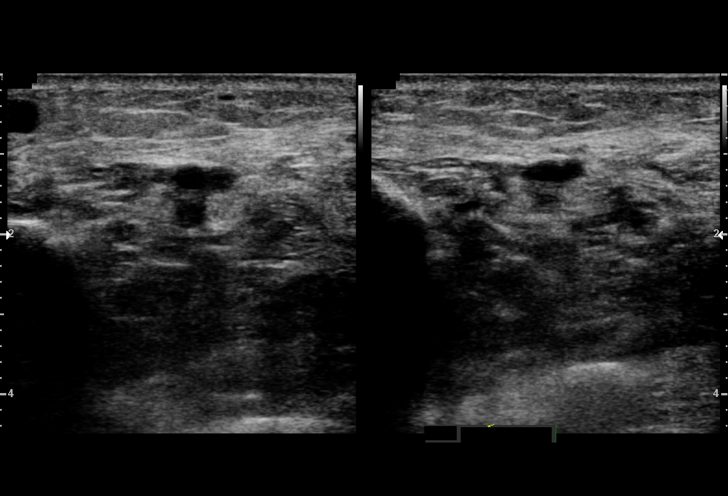
[im 32/36]
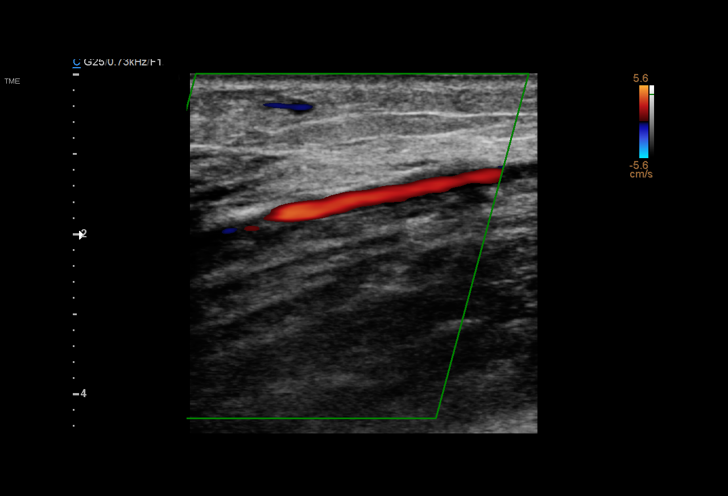
[im 36/36]
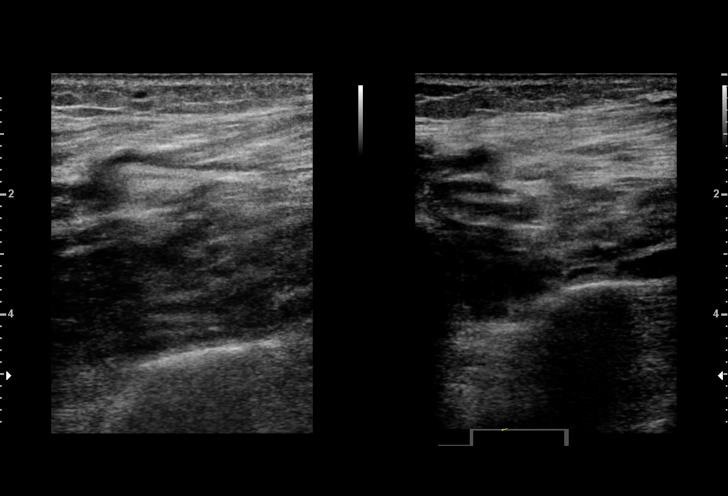

[15 of 24 positions shown; findings below may reference images not displayed]

FINDINGS: Left common femoral vein is compressible without thrombus.

Echogenic thrombus in the distal right femoral vein. The distal
femoral vein is not compressible. There is also echogenic thrombus
in the right popliteal vein. Right posterior tibial veins do not
compress and appear to be thrombosed. Questionable thrombus in the
right peroneal veins. Minimal flow in the distal right femoral vein
and right popliteal vein.

Right common femoral vein is patent. The right saphenofemoral
junction is patent. Right profunda femoral vein is patent without
thrombus. Proximal and mid right femoral vein are patent.
IMPRESSION: Positive for deep venous thrombosis in the right lower extremity.
There is clot involving right calf veins, right popliteal vein and
the distal right femoral vein.

These results will be called to the ordering clinician or
representative by the Radiologist Assistant, and communication
documented in the PACS or zVision Dashboard.

## 2018-04-03 ENCOUNTER — Ambulatory Visit (INDEPENDENT_AMBULATORY_CARE_PROVIDER_SITE_OTHER): Admitting: Nurse Practitioner

## 2018-04-03 ENCOUNTER — Encounter (INDEPENDENT_AMBULATORY_CARE_PROVIDER_SITE_OTHER): Payer: Self-pay | Admitting: Nurse Practitioner

## 2018-04-03 VITALS — BP 140/82 | HR 82 | Temp 98.6°F | Resp 16 | Ht 73.0 in | Wt 322.0 lb

## 2018-04-03 DIAGNOSIS — E039 Hypothyroidism, unspecified: Secondary | ICD-10-CM

## 2018-04-03 DIAGNOSIS — I10 Essential (primary) hypertension: Secondary | ICD-10-CM

## 2018-04-03 DIAGNOSIS — E785 Hyperlipidemia, unspecified: Secondary | ICD-10-CM

## 2018-04-03 DIAGNOSIS — B354 Tinea corporis: Principal | ICD-10-CM

## 2018-04-03 MED ORDER — METOPROLOL SUCCINATE 200 MG OR TB24
200.00 mg | ORAL_TABLET | Freq: Every day | ORAL | Status: DC
Start: ? — End: 2018-05-24

## 2018-04-03 MED ORDER — ATORVASTATIN CALCIUM 40 MG OR TABS
40.00 mg | ORAL_TABLET | Freq: Every day | ORAL | Status: DC
Start: ? — End: 2018-10-24

## 2018-04-03 MED ORDER — CHLORTHALIDONE 50 MG OR TABS
50.00 mg | ORAL_TABLET | Freq: Every day | ORAL | Status: DC
Start: ? — End: 2018-06-08

## 2018-04-03 MED ORDER — ASPIRIN 325 MG OR TABS
325.00 mg | ORAL_TABLET | Freq: Every day | ORAL | Status: DC
Start: ? — End: 2020-04-23

## 2018-04-03 MED ORDER — CLOTRIMAZOLE-BETAMETHASONE 1-0.05 % EX CREA
1.00 | TOPICAL_CREAM | Freq: Two times a day (BID) | CUTANEOUS | 0 refills | Status: DC
Start: 2018-04-03 — End: 2018-07-13

## 2018-04-03 MED ORDER — LEVOTHYROXINE SODIUM 50 MCG OR TABS
50.00 ug | ORAL_TABLET | Freq: Every day | ORAL | Status: DC
Start: ? — End: 2018-04-26

## 2018-04-03 MED ORDER — POTASSIUM CHLORIDE 20 MEQ OR PACK
20.00 meq | PACK | Freq: Every day | ORAL | Status: DC
Start: ? — End: 2018-05-17

## 2018-04-03 NOTE — Assessment & Plan Note (Signed)
Stable  BP 140/82  Check labs  Continue Metoprolol 200 mg and Chlorthalidone 50 mg take as listed. Taking Klor-con 20 MEQ. No refills given.  Advised wt loss, low sodium diet.  F/u prn

## 2018-04-03 NOTE — Progress Notes (Signed)
Encounter Date:  04/03/18  8:16 AM   PCP: Zannie Kehr  MRN: [id number]  DOB: 01-25-1956       HPI:  Juan Guerrero is a 62 year old male presents   Chief Complaint   Patient presents with   . Rash     rash both forearms. pt. states had it for a while. itchy, no pain. x1 year      New Patient presented with c/o pruritic  rash on both arms   Previously treated with Fluocinonide 0.5% cream, not effective  Stated has cats    Recently Moved from [place]  H/o HTN 2017, treated with Metoprolol 200 mg and Chlorthalidone 50 mg. Taking Klor-con 20 MEQ  H/o Hyperlipidemia 2017, treated with Atorvastatin 40 mg, no ASE  H/o Hypothyroidism, treated with Levothyroxine 50 mcg.     Sx hx: Bilateral hip sx in 2017    Scheduled to est care with PCP next week  PROBLEM  LIST:  Patient Active Problem List   Diagnosis   . Essential hypertension   . Hypothyroidism   . Hyperlipidemia         PAST MEDICAL HISTORY:  Past Medical History:   Diagnosis Date   . Back pain    . Hypertension        PAST SURGICAL HISTORY:  Past Surgical History:   Procedure Laterality Date   . COLONOSCOPY  2018   . HIP ARTHROPLASTY      both        FAMILY HISTORY:   Family History   Problem Relation Name Age of Onset   . Hypertension Mother     . Obesity Mother     . Stroke Mother     . Cholesterol/Lipid Disorder Father           SOCIAL HISTORY:  Social History     Socioeconomic History   . Marital status: Not on file     Spouse name: Not on file   . Number of children: Not on file   . Years of education: Not on file   . Highest education level: Not on file   Occupational History   . Not on file   Social Needs   . Financial resource strain: Not on file   . Food insecurity:     Worry: Not on file     Inability: Not on file   . Transportation needs:     Medical: Not on file     Non-medical: Not on file   Tobacco Use   . Smoking status: Former Games developer   . Smokeless tobacco: Never Used   Substance and Sexual Activity   . Alcohol use: Yes   . Drug use: No   . Sexual  activity: Not on file   Lifestyle   . Physical activity:     Days per week: Not on file     Minutes per session: Not on file   . Stress: Not on file   Relationships   . Social connections:     Talks on phone: Not on file     Gets together: Not on file     Attends religious service: Not on file     Active member of club or organization: Not on file     Attends meetings of clubs or organizations: Not on file     Relationship status: Not on file   . Intimate partner violence:     Fear of current or ex partner: Not  on file     Emotionally abused: Not on file     Physically abused: Not on file     Forced sexual activity: Not on file   Other Topics Concern   . Not on file   Social History Narrative   . Not on file     Social History     Tobacco Use   Smoking Status Former Smoker   Smokeless Tobacco Never Used      Social History     Substance and Sexual Activity   Alcohol Use Yes      Social History     Substance and Sexual Activity   Drug Use No          There is no immunization history on file for this patient.      CURRENT  MEDICATIONS:  Current Outpatient Medications on File Prior to Visit   Medication Sig Dispense Refill   . aspirin 325 MG tablet Take 325 mg by mouth daily.     Marland Kitchen atorvastatin (LIPITOR) 40 MG tablet Take 40 mg by mouth daily.     . chlorthalidone (HYGROTEN) 50 MG tablet Take 50 mg by mouth daily.     Marland Kitchen levothyroxine (SYNTHROID) 50 MCG tablet Take 50 mcg by mouth every morning (before breakfast).     . metoprolol succinate (TOPROL XL) 200 MG Extended-Release tablet Take 200 mg by mouth daily.     . potassium chloride (KLOR-CON) 20 MEQ packet Take 20 mEq by mouth daily.       No current facility-administered medications on file prior to visit.      Outpatient Medications Marked as Taking for the 04/03/18 encounter (Office Visit) with Doristine Locks, NP   Medication Sig Dispense Refill   . aspirin 325 MG tablet Take 325 mg by mouth daily.     Marland Kitchen atorvastatin (LIPITOR) 40 MG tablet Take 40 mg by mouth  daily.     . chlorthalidone (HYGROTEN) 50 MG tablet Take 50 mg by mouth daily.     Marland Kitchen levothyroxine (SYNTHROID) 50 MCG tablet Take 50 mcg by mouth every morning (before breakfast).     . metoprolol succinate (TOPROL XL) 200 MG Extended-Release tablet Take 200 mg by mouth daily.     . potassium chloride (KLOR-CON) 20 MEQ packet Take 20 mEq by mouth daily.          ALLERGIES:    No Known Allergies       REVIEW OF SYSTEMS:  Review of Systems   Constitutional: Negative.    Respiratory: Negative.    Cardiovascular: Negative.    Skin: Positive for rash (pruritic rash both FA).         PHYSICAL EXAM:   04/03/18  0753   BP: 140/82   Pulse: 82   Resp: 16   Temp: 98.6 F (37 C)     Body mass index is 42.48 kg/m.    Ht Readings from Last 1 Encounters:   04/03/18 6\' 1"  (1.854 m)     Wt Readings from Last 1 Encounters:   04/03/18 146.1 kg (322 lb)           Physical Exam   Constitutional: He is oriented to person, place, and time. He appears well-developed and well-nourished.   HENT:   Head: Normocephalic and atraumatic.   Eyes: EOM are normal.   Neck: Normal range of motion.   Cardiovascular: Normal rate and regular rhythm.   Pulmonary/Chest: Effort normal and breath sounds normal.  Musculoskeletal: Normal range of motion.   Neurological: He is alert and oriented to person, place, and time.   Skin: Skin is warm. Rash (scaly, erythrmatous, circular rash on bilateral FA) noted.   Psychiatric: He has a normal mood and affect. His behavior is normal.   Nursing note and vitals reviewed.          ASSESSMENT & PLAN:    [name] was seen today for rash.    Diagnoses and all orders for this visit:    Tinea corporis  Worsening  Start Lotrisone 1-0.05% cream, apply to affected area ad  Contact precautions given  F/u prn worsening s/s    -     clotrimazole-betamethasone (LOTRISONE) 1-0.05 % cream; Apply 1 Application topically 2 times daily. Use a small amount as directed    Essential hypertension  Assessment & Plan:  Stable  BP  140/82  Check labs  Continue Metoprolol 200 mg and Chlorthalidone 50 mg take as listed. Taking Klor-con 20 MEQ. No refills given.  Advised wt loss, low sodium diet.  F/u prn    Orders:  -     Comprehensive Metabolic Panel Green; Future    Hypothyroidism, unspecified type  Assessment & Plan:  Active  Check labs  Continue taking Levothyroxine 50 mcg as listed  No refill given  F/u prn results    Orders:  -     TSH, Blood - See Instructions; Future    Hyperlipidemia, unspecified hyperlipidemia type  Assessment & Plan:  Active  Check labs  Continue taking  Atorvastatin 40 mg as listed  No refill given  Advised improved diet, wt loss  F/u prn results    Orders:  -     Lipid Panel Green Plasma Separator Tube; Future        ICD-10-CM ICD-9-CM    1. Tinea corporis B35.4 110.5 clotrimazole-betamethasone (LOTRISONE) 1-0.05 % cream   2. Essential hypertension I10 401.9 Comprehensive Metabolic Panel Green   3. Hypothyroidism, unspecified type E03.9 244.9 TSH, Blood - See Instructions   4. Hyperlipidemia, unspecified hyperlipidemia type E78.5 272.4 Lipid Panel Green Plasma Separator Tube             Doristine Locks, NP  [facility]  [email or web address]

## 2018-04-03 NOTE — Assessment & Plan Note (Signed)
Active  Check labs  Continue taking Levothyroxine 50 mcg as listed  No refill given  F/u prn results

## 2018-04-03 NOTE — Assessment & Plan Note (Addendum)
Active  Check labs  Continue taking  Atorvastatin 40 mg as listed  No refill given  Advised improved diet, wt loss  F/u prn results

## 2018-04-06 ENCOUNTER — Encounter (INDEPENDENT_AMBULATORY_CARE_PROVIDER_SITE_OTHER): Payer: Self-pay | Admitting: Nurse Practitioner

## 2018-04-06 ENCOUNTER — Telehealth (INDEPENDENT_AMBULATORY_CARE_PROVIDER_SITE_OTHER): Payer: Self-pay | Admitting: Nurse Practitioner

## 2018-04-06 DIAGNOSIS — R7301 Impaired fasting glucose: Principal | ICD-10-CM

## 2018-04-06 LAB — COMPREHENSIVE METABOLIC PANEL, BLOOD
ALT (SGPT): 28 U/L (ref 9–46)
AST (SGOT): 32 U/L (ref 10–35)
Albumin/Glob Ratio: 1.3 (calc) (ref 1.0–2.5)
Albumin: 4.3 g/dL (ref 3.6–5.1)
Alkaline Phos: 93 U/L (ref 40–115)
BUN: 19 mg/dL (ref 7–25)
Bilirubin, Total: 0.8 mg/dL (ref 0.2–1.2)
Calcium: 9.6 mg/dL (ref 8.6–10.3)
Carbon Dioxide: 29 mmol/L (ref 20–32)
Chloride: 98 mmol/L (ref 98–110)
Creatinine: 1.15 mg/dL (ref 0.70–1.25)
Globulin: 3.4 g/dL (calc) (ref 1.9–3.7)
Glucose: 120 mg/dL — ABNORMAL HIGH (ref 65–99)
Potassium: 3.8 mmol/L (ref 3.5–5.3)
Sodium: 139 mmol/L (ref 135–146)
Total Protein: 7.7 g/dL (ref 6.1–8.1)
eGFR African American: 79 mL/min/{1.73_m2} (ref >=60)
eGFR non-Afr.American: 68 mL/min/{1.73_m2} (ref >=60)

## 2018-04-06 LAB — LIPID(CHOL FRACT) PANEL, BLOOD
Chol/HDLC Ratio: 3.3 (calc) (ref <5.0)
Cholesterol: 129 mg/dL (ref <200)
HDL Cholesterol: 39 mg/dL — ABNORMAL LOW (ref >40)
LDL-Cholesterol: 71 mg/dL (calc)
Non-HDL Cholesterol: 90 mg/dL (calc) (ref <130)
Triglycerides: 106 mg/dL (ref <150)

## 2018-04-06 LAB — TSH, BLOOD: TSH: 3.84 mIU/L (ref 0.40–4.50)

## 2018-04-06 NOTE — Telephone Encounter (Signed)
-----   Message from Doristine Locks, NP sent at 04/06/2018  2:46 PM PDT -----  Lipid and TSH results within normal range.  Liver and Kidney function within normal range. Elevated glucose (120), a glucose value between 100 and 125 is consistent with prediabetes. HgbA1c has been ordered to confirm diagnosis.

## 2018-04-06 NOTE — Telephone Encounter (Signed)
Pt was notified.  

## 2018-04-24 ENCOUNTER — Other Ambulatory Visit (INDEPENDENT_AMBULATORY_CARE_PROVIDER_SITE_OTHER): Payer: Self-pay | Admitting: Nurse Practitioner

## 2018-04-26 ENCOUNTER — Other Ambulatory Visit (INDEPENDENT_AMBULATORY_CARE_PROVIDER_SITE_OTHER): Payer: Self-pay | Admitting: Nurse Practitioner

## 2018-04-26 DIAGNOSIS — Z760 Encounter for issue of repeat prescription: Principal | ICD-10-CM

## 2018-04-26 NOTE — Telephone Encounter (Signed)
Last seen 04/03/18. Labs 04/05/18.

## 2018-04-30 MED ORDER — LEVOTHYROXINE SODIUM 50 MCG OR TABS
50.0000 ug | ORAL_TABLET | Freq: Every day | ORAL | 1 refills | Status: DC
Start: 2018-04-30 — End: 2018-10-24

## 2018-05-17 ENCOUNTER — Other Ambulatory Visit (INDEPENDENT_AMBULATORY_CARE_PROVIDER_SITE_OTHER): Payer: Self-pay | Admitting: Nurse Practitioner

## 2018-05-17 DIAGNOSIS — I10 Essential (primary) hypertension: Principal | ICD-10-CM

## 2018-05-17 NOTE — Telephone Encounter (Signed)
Patient called requesting a refill on Potassium Chloride. Patient states he just ran out. Please advise.

## 2018-05-18 ENCOUNTER — Other Ambulatory Visit (INDEPENDENT_AMBULATORY_CARE_PROVIDER_SITE_OTHER): Payer: Self-pay | Admitting: Nurse Practitioner

## 2018-05-18 DIAGNOSIS — Z760 Encounter for issue of repeat prescription: Principal | ICD-10-CM

## 2018-05-18 MED ORDER — POTASSIUM CHLORIDE 20 MEQ OR PACK
20.0000 meq | PACK | Freq: Every day | ORAL | 1 refills | Status: DC
Start: 2018-05-18 — End: 2018-10-01

## 2018-05-18 MED ORDER — POTASSIUM CHLORIDE CRYS CR 20 MEQ OR TBCR
20.0000 meq | EXTENDED_RELEASE_TABLET | Freq: Every day | ORAL | 1 refills | Status: DC
Start: 2018-05-18 — End: 2018-08-13

## 2018-05-18 NOTE — Telephone Encounter (Signed)
Patient uses tablets not packets.

## 2018-05-18 NOTE — Telephone Encounter (Signed)
Needs to establish care with PCP

## 2018-05-24 ENCOUNTER — Other Ambulatory Visit (INDEPENDENT_AMBULATORY_CARE_PROVIDER_SITE_OTHER): Payer: Self-pay | Admitting: Nurse Practitioner

## 2018-05-24 DIAGNOSIS — I10 Essential (primary) hypertension: Principal | ICD-10-CM

## 2018-05-24 NOTE — Telephone Encounter (Signed)
Pt is leaving to [place] this Sunday and needs them asap

## 2018-05-25 MED ORDER — METOPROLOL SUCCINATE 200 MG OR TB24
200.00 mg | ORAL_TABLET | Freq: Every day | ORAL | 1 refills | Status: DC
Start: 2018-05-25 — End: 2018-11-20

## 2018-05-25 NOTE — Telephone Encounter (Signed)
Patient needs to be seen to establish PCP

## 2018-05-25 NOTE — Telephone Encounter (Signed)
Pt was notified and he will call back when he gets back from his trip to schedule a appt with PCP

## 2018-06-08 ENCOUNTER — Other Ambulatory Visit (INDEPENDENT_AMBULATORY_CARE_PROVIDER_SITE_OTHER): Payer: Self-pay | Admitting: Nurse Practitioner

## 2018-06-08 MED ORDER — CHLORTHALIDONE 50 MG OR TABS
50.0000 mg | ORAL_TABLET | Freq: Every day | ORAL | 2 refills | Status: DC
Start: 2018-06-08 — End: 2018-12-04

## 2018-07-08 ENCOUNTER — Other Ambulatory Visit: Payer: Self-pay

## 2018-07-11 ENCOUNTER — Ambulatory Visit (INDEPENDENT_AMBULATORY_CARE_PROVIDER_SITE_OTHER): Admitting: Family Medicine

## 2018-07-11 VITALS — BP 140/80 | HR 67 | Temp 99.3°F | Resp 14 | Ht 73.0 in | Wt 321.2 lb

## 2018-07-13 ENCOUNTER — Encounter (INDEPENDENT_AMBULATORY_CARE_PROVIDER_SITE_OTHER): Payer: Self-pay | Admitting: Family Medicine

## 2018-07-13 MED ORDER — CLOTRIMAZOLE-BETAMETHASONE 1-0.05 % EX CREA
1.0000 | TOPICAL_CREAM | Freq: Two times a day (BID) | CUTANEOUS | 0 refills | Status: DC
Start: 2018-07-13 — End: 2021-06-29

## 2018-08-13 ENCOUNTER — Other Ambulatory Visit (INDEPENDENT_AMBULATORY_CARE_PROVIDER_SITE_OTHER): Payer: Self-pay | Admitting: Nurse Practitioner

## 2018-08-13 DIAGNOSIS — Z760 Encounter for issue of repeat prescription: Principal | ICD-10-CM

## 2018-08-14 MED ORDER — POTASSIUM CHLORIDE CRYS CR 20 MEQ OR TBCR
20.0000 meq | EXTENDED_RELEASE_TABLET | Freq: Every day | ORAL | 1 refills | Status: DC
Start: 2018-08-14 — End: 2018-11-10

## 2018-10-01 ENCOUNTER — Encounter (INDEPENDENT_AMBULATORY_CARE_PROVIDER_SITE_OTHER): Payer: Self-pay | Admitting: Family Medicine

## 2018-10-01 ENCOUNTER — Ambulatory Visit (INDEPENDENT_AMBULATORY_CARE_PROVIDER_SITE_OTHER): Admitting: Family Medicine

## 2018-10-01 VITALS — BP 150/90 | HR 62 | Temp 99.8°F | Resp 16 | Ht 73.0 in | Wt 325.0 lb

## 2018-10-01 DIAGNOSIS — B354 Tinea corporis: Principal | ICD-10-CM

## 2018-10-01 DIAGNOSIS — R739 Hyperglycemia, unspecified: Secondary | ICD-10-CM

## 2018-10-01 MED ORDER — ASPIRIN 81 MG OR TABS: 81.00 mg | ORAL_TABLET | Freq: Every day | ORAL | Status: AC

## 2018-10-02 ENCOUNTER — Telehealth (INDEPENDENT_AMBULATORY_CARE_PROVIDER_SITE_OTHER): Payer: Self-pay | Admitting: Family Medicine

## 2018-10-02 DIAGNOSIS — R739 Hyperglycemia, unspecified: Secondary | ICD-10-CM

## 2018-10-02 DIAGNOSIS — R7303 Prediabetes: Secondary | ICD-10-CM

## 2018-10-02 LAB — BASIC METABOLIC PANEL, BLOOD
BUN: 18 mg/dL (ref 7–25)
Calcium: 10.4 mg/dL — ABNORMAL HIGH (ref 8.6–10.3)
Carbon Dioxide: 29 mmol/L (ref 20–32)
Chloride: 100 mmol/L (ref 98–110)
Creatinine: 1.25 mg/dL (ref 0.70–1.25)
Glucose: 107 mg/dL — ABNORMAL HIGH (ref 65–99)
Potassium: 4.3 mmol/L (ref 3.5–5.3)
Sodium: 140 mmol/L (ref 135–146)
eGFR African American: 72 mL/min/{1.73_m2} (ref >=60)
eGFR non-Afr.American: 62 mL/min/{1.73_m2} (ref >=60)

## 2018-10-02 LAB — GLYCOSYLATED HGB(A1C), BLOOD: Hgb A1C: 6.4 % of total Hgb — ABNORMAL HIGH (ref <5.7)

## 2018-10-02 MED ORDER — KETOCONAZOLE 2 % EX CREA
1.00 | TOPICAL_CREAM | Freq: Every day | CUTANEOUS | 1 refills | Status: DC
Start: 2018-10-02 — End: 2018-11-23

## 2018-10-02 MED ORDER — BETAMETHASONE DIPROPIONATE 0.05 % EX CREA
1.0000 | TOPICAL_CREAM | Freq: Two times a day (BID) | CUTANEOUS | 1 refills | Status: DC
Start: 2018-10-02 — End: 2018-11-23

## 2018-10-24 ENCOUNTER — Other Ambulatory Visit (INDEPENDENT_AMBULATORY_CARE_PROVIDER_SITE_OTHER): Payer: Self-pay | Admitting: Nurse Practitioner

## 2018-10-24 MED ORDER — LEVOTHYROXINE SODIUM 50 MCG OR TABS
50.0000 ug | ORAL_TABLET | Freq: Every day | ORAL | 1 refills | Status: DC
Start: 2018-10-24 — End: 2019-01-23

## 2018-10-24 MED ORDER — ATORVASTATIN CALCIUM 40 MG OR TABS
40.0000 mg | ORAL_TABLET | Freq: Every day | ORAL | 1 refills | Status: DC
Start: 2018-10-24 — End: 2019-01-23

## 2018-11-05 ENCOUNTER — Encounter (INDEPENDENT_AMBULATORY_CARE_PROVIDER_SITE_OTHER): Payer: Self-pay | Admitting: Family Medicine

## 2018-11-06 ENCOUNTER — Encounter (INDEPENDENT_AMBULATORY_CARE_PROVIDER_SITE_OTHER): Payer: Self-pay | Admitting: Family Medicine

## 2018-11-10 ENCOUNTER — Other Ambulatory Visit (INDEPENDENT_AMBULATORY_CARE_PROVIDER_SITE_OTHER): Payer: Self-pay | Admitting: Nurse Practitioner

## 2018-11-13 MED ORDER — POTASSIUM CHLORIDE CRYS CR 20 MEQ OR TBCR
20.0000 meq | EXTENDED_RELEASE_TABLET | Freq: Every day | ORAL | 1 refills | Status: DC
Start: 2018-11-13 — End: 2019-08-21

## 2018-11-20 ENCOUNTER — Other Ambulatory Visit (INDEPENDENT_AMBULATORY_CARE_PROVIDER_SITE_OTHER): Payer: Self-pay | Admitting: Nurse Practitioner

## 2018-11-20 DIAGNOSIS — I10 Essential (primary) hypertension: Principal | ICD-10-CM

## 2018-11-21 MED ORDER — METOPROLOL SUCCINATE 200 MG OR TB24
200.0000 mg | ORAL_TABLET | Freq: Every day | ORAL | 1 refills | Status: DC
Start: 2018-11-21 — End: 2019-08-21

## 2018-11-23 ENCOUNTER — Other Ambulatory Visit (INDEPENDENT_AMBULATORY_CARE_PROVIDER_SITE_OTHER): Payer: Self-pay | Admitting: Family Medicine

## 2018-11-23 DIAGNOSIS — B354 Tinea corporis: Principal | ICD-10-CM

## 2018-11-23 MED ORDER — KETOCONAZOLE 2 % EX CREA
1.00 | TOPICAL_CREAM | Freq: Every day | CUTANEOUS | 2 refills | Status: DC
Start: 2018-11-23 — End: 2020-04-23

## 2018-11-23 MED ORDER — BETAMETHASONE DIPROPIONATE 0.05 % EX CREA
1.00 | TOPICAL_CREAM | Freq: Two times a day (BID) | CUTANEOUS | 1 refills | Status: DC
Start: 2018-11-23 — End: 2020-04-23

## 2018-12-04 ENCOUNTER — Other Ambulatory Visit (INDEPENDENT_AMBULATORY_CARE_PROVIDER_SITE_OTHER): Payer: Self-pay | Admitting: Nurse Practitioner

## 2018-12-04 MED ORDER — CHLORTHALIDONE 50 MG OR TABS
50.0000 mg | ORAL_TABLET | Freq: Every day | ORAL | 1 refills | Status: DC
Start: 2018-12-04 — End: 2019-08-31

## 2018-12-24 ENCOUNTER — Telehealth (INDEPENDENT_AMBULATORY_CARE_PROVIDER_SITE_OTHER): Payer: Self-pay | Admitting: Family Medicine

## 2018-12-26 ENCOUNTER — Encounter (INDEPENDENT_AMBULATORY_CARE_PROVIDER_SITE_OTHER): Payer: Self-pay | Admitting: Family Medicine

## 2018-12-26 DIAGNOSIS — Z96643 Presence of artificial hip joint, bilateral: Principal | ICD-10-CM

## 2019-01-23 ENCOUNTER — Telehealth: Payer: Self-pay | Admitting: Hospital

## 2019-01-23 ENCOUNTER — Other Ambulatory Visit (INDEPENDENT_AMBULATORY_CARE_PROVIDER_SITE_OTHER): Payer: Self-pay | Admitting: Nurse Practitioner

## 2019-01-23 DIAGNOSIS — E039 Hypothyroidism, unspecified: Secondary | ICD-10-CM

## 2019-01-23 DIAGNOSIS — Z760 Encounter for issue of repeat prescription: Secondary | ICD-10-CM

## 2019-01-23 DIAGNOSIS — E785 Hyperlipidemia, unspecified: Principal | ICD-10-CM

## 2019-01-23 NOTE — Telephone Encounter (Signed)
Verified: E-mail Address   MyChart Activation code sent to patient's: E-mail Address

## 2019-01-25 MED ORDER — ATORVASTATIN CALCIUM 40 MG OR TABS
40.00 mg | ORAL_TABLET | Freq: Every day | ORAL | 0 refills | Status: DC
Start: 2019-01-25 — End: 2019-07-24

## 2019-01-25 MED ORDER — LEVOTHYROXINE SODIUM 50 MCG OR TABS
50.00 ug | ORAL_TABLET | Freq: Every day | ORAL | 0 refills | Status: DC
Start: 2019-01-25 — End: 2019-07-24

## 2019-02-22 ENCOUNTER — Ambulatory Visit (INDEPENDENT_AMBULATORY_CARE_PROVIDER_SITE_OTHER): Admitting: Nurse Practitioner

## 2019-02-22 ENCOUNTER — Encounter (INDEPENDENT_AMBULATORY_CARE_PROVIDER_SITE_OTHER): Payer: Self-pay | Admitting: Nurse Practitioner

## 2019-02-22 VITALS — BP 160/90 | HR 60 | Temp 98.4°F | Resp 20 | Ht 73.0 in | Wt 319.0 lb

## 2019-02-22 MED ORDER — COLCHICINE 0.6 MG OR TABS
0.60 mg | ORAL_TABLET | Freq: Every day | ORAL | 0 refills | Status: DC
Start: 2019-02-22 — End: 2019-06-13

## 2019-02-24 LAB — URIC ACID, BLOOD: Uric Acid: 9.6 mg/dL — ABNORMAL HIGH (ref 4.0–8.0)

## 2019-02-24 LAB — SPECIMEN INTEGRITY COMPROMISED

## 2019-02-25 ENCOUNTER — Telehealth (INDEPENDENT_AMBULATORY_CARE_PROVIDER_SITE_OTHER): Payer: Self-pay | Admitting: Nurse Practitioner

## 2019-03-02 ENCOUNTER — Encounter (INDEPENDENT_AMBULATORY_CARE_PROVIDER_SITE_OTHER): Payer: Self-pay | Admitting: Nurse Practitioner

## 2019-04-04 ENCOUNTER — Encounter (INDEPENDENT_AMBULATORY_CARE_PROVIDER_SITE_OTHER): Payer: Self-pay

## 2019-06-09 ENCOUNTER — Other Ambulatory Visit (INDEPENDENT_AMBULATORY_CARE_PROVIDER_SITE_OTHER): Payer: Self-pay | Admitting: Nurse Practitioner

## 2019-06-12 ENCOUNTER — Other Ambulatory Visit (INDEPENDENT_AMBULATORY_CARE_PROVIDER_SITE_OTHER): Payer: Self-pay | Admitting: Nurse Practitioner

## 2019-06-13 ENCOUNTER — Ambulatory Visit (INDEPENDENT_AMBULATORY_CARE_PROVIDER_SITE_OTHER): Admitting: Physician Assistant

## 2019-06-13 ENCOUNTER — Ambulatory Visit (INDEPENDENT_AMBULATORY_CARE_PROVIDER_SITE_OTHER): Admitting: General Practice

## 2019-06-13 VITALS — BP 140/78 | HR 70 | Temp 95.2°F | Resp 14 | Ht 73.0 in | Wt 312.8 lb

## 2019-06-13 MED ORDER — COLCHICINE 0.6 MG OR TABS
0.6000 mg | ORAL_TABLET | Freq: Every day | ORAL | 0 refills | Status: DC
Start: 2019-06-13 — End: 2019-07-29

## 2019-06-13 MED ORDER — ERYTHROMYCIN BASE 500 MG OR TABS
1.00 | ORAL_TABLET | Freq: Every day | ORAL | Status: DC
Start: 2019-06-09 — End: 2019-07-29

## 2019-07-24 ENCOUNTER — Other Ambulatory Visit (INDEPENDENT_AMBULATORY_CARE_PROVIDER_SITE_OTHER): Payer: Self-pay | Admitting: Nurse Practitioner

## 2019-07-26 ENCOUNTER — Other Ambulatory Visit (INDEPENDENT_AMBULATORY_CARE_PROVIDER_SITE_OTHER): Payer: Self-pay | Admitting: Nurse Practitioner

## 2019-07-29 ENCOUNTER — Encounter (INDEPENDENT_AMBULATORY_CARE_PROVIDER_SITE_OTHER): Payer: Self-pay | Admitting: Nurse Practitioner

## 2019-07-29 MED ORDER — LEVOTHYROXINE SODIUM 50 MCG OR TABS
50.00 ug | ORAL_TABLET | Freq: Every day | ORAL | 0 refills | Status: DC
Start: 2019-07-29 — End: 2020-04-23

## 2019-07-29 MED ORDER — ATORVASTATIN CALCIUM 40 MG OR TABS
40.00 mg | ORAL_TABLET | Freq: Every day | ORAL | 0 refills | Status: DC
Start: 2019-07-29 — End: 2020-04-23

## 2019-07-29 NOTE — Telephone Encounter (Signed)
Patient needs CPE with labs prior to next refill request

## 2019-07-31 MED ORDER — LEVOTHYROXINE SODIUM 50 MCG OR TABS
ORAL_TABLET | ORAL | 0 refills | Status: DC
Start: 2019-07-31 — End: 2020-01-20

## 2019-07-31 MED ORDER — ATORVASTATIN CALCIUM 40 MG OR TABS
ORAL_TABLET | ORAL | 0 refills | Status: DC
Start: 2019-07-31 — End: 2020-01-20

## 2019-08-19 ENCOUNTER — Other Ambulatory Visit (INDEPENDENT_AMBULATORY_CARE_PROVIDER_SITE_OTHER): Payer: Self-pay | Admitting: Nurse Practitioner

## 2019-08-21 ENCOUNTER — Other Ambulatory Visit (INDEPENDENT_AMBULATORY_CARE_PROVIDER_SITE_OTHER): Payer: Self-pay | Admitting: Nurse Practitioner

## 2019-08-21 MED ORDER — METOPROLOL SUCCINATE 200 MG OR TB24
200.0000 mg | ORAL_TABLET | Freq: Every day | ORAL | 2 refills | Status: DC
Start: 2019-08-21 — End: 2020-05-15

## 2019-08-21 MED ORDER — POTASSIUM CHLORIDE CRYS CR 20 MEQ OR TBCR
20.0000 meq | EXTENDED_RELEASE_TABLET | Freq: Every day | ORAL | 2 refills | Status: DC
Start: 2019-08-21 — End: 2019-09-19

## 2019-08-21 NOTE — Telephone Encounter (Signed)
Per Dr. Lorrene Reid, refilled metoprolol & potassium

## 2019-08-21 NOTE — Telephone Encounter (Signed)
Pt requesting a med refill on metoprolol and Potassium LOV 06/13/19 pt states he called in the refill from [facility] and they did not contact us and now he is  is out of his potassium and already requested 3 pills from [facility].

## 2019-08-23 ENCOUNTER — Other Ambulatory Visit (INDEPENDENT_AMBULATORY_CARE_PROVIDER_SITE_OTHER): Payer: Self-pay | Admitting: Family Medicine

## 2019-08-23 NOTE — Telephone Encounter (Signed)
[facility] is req. A rf on Klor-con M20MEQ tabs #90

## 2019-08-27 NOTE — Telephone Encounter (Signed)
Refilled 08/21/19

## 2019-08-31 ENCOUNTER — Other Ambulatory Visit (INDEPENDENT_AMBULATORY_CARE_PROVIDER_SITE_OTHER): Payer: Self-pay | Admitting: Nurse Practitioner

## 2019-09-03 MED ORDER — CHLORTHALIDONE 50 MG OR TABS
50.0000 mg | ORAL_TABLET | Freq: Every day | ORAL | 1 refills | Status: DC
Start: 2019-09-03 — End: 2020-03-04

## 2019-09-18 ENCOUNTER — Encounter (INDEPENDENT_AMBULATORY_CARE_PROVIDER_SITE_OTHER): Payer: Self-pay

## 2019-09-19 ENCOUNTER — Encounter (INDEPENDENT_AMBULATORY_CARE_PROVIDER_SITE_OTHER): Payer: Self-pay

## 2019-09-19 ENCOUNTER — Other Ambulatory Visit (INDEPENDENT_AMBULATORY_CARE_PROVIDER_SITE_OTHER): Payer: Self-pay | Admitting: Family Medicine

## 2019-09-20 MED ORDER — POTASSIUM CHLORIDE CRYS CR 20 MEQ OR TBCR
20.0000 meq | EXTENDED_RELEASE_TABLET | Freq: Every day | ORAL | 2 refills | Status: DC
Start: 2019-09-20 — End: 2020-11-06

## 2019-09-23 ENCOUNTER — Encounter (INDEPENDENT_AMBULATORY_CARE_PROVIDER_SITE_OTHER): Payer: Self-pay

## 2020-01-19 ENCOUNTER — Other Ambulatory Visit (INDEPENDENT_AMBULATORY_CARE_PROVIDER_SITE_OTHER): Payer: Self-pay | Admitting: Nurse Practitioner

## 2020-01-20 MED ORDER — ATORVASTATIN CALCIUM 40 MG OR TABS
ORAL_TABLET | ORAL | 0 refills | Status: DC
Start: 2020-01-20 — End: 2020-04-23

## 2020-01-20 MED ORDER — LEVOTHYROXINE SODIUM 50 MCG OR TABS
ORAL_TABLET | ORAL | 0 refills | Status: DC
Start: 2020-01-20 — End: 2020-04-23

## 2020-02-29 ENCOUNTER — Other Ambulatory Visit (INDEPENDENT_AMBULATORY_CARE_PROVIDER_SITE_OTHER): Payer: Self-pay | Admitting: Nurse Practitioner

## 2020-03-04 MED ORDER — CHLORTHALIDONE 50 MG OR TABS
ORAL_TABLET | ORAL | 0 refills | Status: DC
Start: 2020-03-04 — End: 2020-05-31

## 2020-04-18 ENCOUNTER — Other Ambulatory Visit (INDEPENDENT_AMBULATORY_CARE_PROVIDER_SITE_OTHER): Payer: Self-pay | Admitting: Nurse Practitioner

## 2020-04-22 NOTE — Telephone Encounter (Signed)
 Patient scheduled for fu appt 04/22/20  In office appt 04/23/20 at 9:10am  With Treasa Friend.

## 2020-04-22 NOTE — Telephone Encounter (Signed)
 LDVM okay in FYI. Informed pt to call office to schedule a follow up.

## 2020-04-22 NOTE — Telephone Encounter (Signed)
Needs appt prior to refill

## 2020-04-23 ENCOUNTER — Ambulatory Visit (INDEPENDENT_AMBULATORY_CARE_PROVIDER_SITE_OTHER): Admitting: Nurse Practitioner

## 2020-04-23 VITALS — BP 132/82 | HR 66 | Temp 97.8°F | Resp 18 | Ht 73.0 in | Wt 315.4 lb

## 2020-04-23 MED ORDER — LEVOTHYROXINE SODIUM 50 MCG OR TABS
50.0000 ug | ORAL_TABLET | Freq: Every day | ORAL | 0 refills | Status: DC
Start: 2020-04-23 — End: 2020-07-31

## 2020-04-23 MED ORDER — ATORVASTATIN CALCIUM 40 MG OR TABS
40.0000 mg | ORAL_TABLET | Freq: Every day | ORAL | 0 refills | Status: DC
Start: 2020-04-23 — End: 2020-07-22

## 2020-04-23 MED ORDER — TART CHERRY ADVANCED PO: Freq: Three times a day (TID) | ORAL | Status: AC

## 2020-04-23 NOTE — Progress Notes (Signed)
 [facility]  [place] - [place]-  [place] - [place] - [place]   [email or web address] and [email or web address]  Telephone: [phone]             Encounter Date:  04/23/20  9:15 AM   PCP: Stanford Earl  MRN: [id number]  DOB: 1956/11/03       HPI:  Juan Guerrero is a 64 year old male presents   Chief Complaint   Patient presents with   . Medication Review     64 yo M pt here to discuss medications levothyroxin and atorvastatin      Patient presented for medication refill  Last labs 2019  Stated feeling in good health   Denied CP or SOB  Denied stress, is retired and very happy   Colonoscopy 2018, normal    Needs CPE with labs        PROBLEM  LIST:  Patient Active Problem List   Diagnosis   . Essential hypertension   . Hypothyroidism   . Hyperlipidemia   . Tinea corporis   . H/O bilateral hip replacements   . Elevated blood sugar   . Acute gout involving toe of right foot, unspecified cause   . Acute pain of right knee   . Morbid obesity (CMS-HCC)   . Screening for colon cancer         PAST MEDICAL HISTORY:  Past Medical History:   Diagnosis Date   . Back pain    . Hypertension        PAST SURGICAL HISTORY:  Past Surgical History:   Procedure Laterality Date   . COLONOSCOPY  2018   . HIP ARTHROPLASTY      both        FAMILY HISTORY:   Family History   Problem Relation Name Age of Onset   . Hypertension Mother     . Obesity Mother     . Stroke Mother     . Cholesterol/Lipid Disorder Father           SOCIAL HISTORY:  Social History     Socioeconomic History   . Marital status: Married     Spouse name: Not on file   . Number of children: Not on file   . Years of education: Not on file   . Highest education level: Not on file   Occupational History   . Not on file   Social Needs   . Financial resource strain: Not on file   . Food insecurity     Worry: Not on file     Inability: Not on file   . Transportation needs     Medical: Not on file     Non-medical: Not on file   Tobacco Use   . Smoking  status: Former Games developer   . Smokeless tobacco: Never Used   Substance and Sexual Activity   . Alcohol use: Yes     Frequency: 4 or more times a week     Drinks per session: 5 or 6     Binge frequency: Weekly   . Drug use: No   . Sexual activity: Not on file   Lifestyle   . Physical activity     Days per week: 2 days     Minutes per session: 30 min   . Stress: Not on file   Relationships   . Social Wellsite geologist on phone: Not on file     Gets together: Not on  file     Attends religious service: Not on file     Active member of club or organization: Not on file     Attends meetings of clubs or organizations: Not on file     Relationship status: Not on file   . Intimate partner violence     Fear of current or ex partner: Not on file     Emotionally abused: Not on file     Physically abused: Not on file     Forced sexual activity: Not on file   Other Topics Concern   . Military Service Not Asked   . Blood Transfusions Not Asked   . Caffeine Concern No   . Occupational Exposure Not Asked   . Hobby Hazards Not Asked   . Sleep Concern Not Asked   . Stress Concern Not Asked   . Weight Concern Not Asked   . Special Diet No   . Back Care Not Asked   . Exercises Regularly Yes   . Bike Helmet Use Not Asked   . Seat Belt Use Yes   . Performs Self-Exams Not Asked   Social History Narrative   . Not on file     Social History     Tobacco Use   Smoking Status Former Smoker   Smokeless Tobacco Never Used      Social History     Substance and Sexual Activity   Alcohol Use Yes   . Frequency: 4 or more times a week   . Drinks per session: 5 or 6   . Binge frequency: Weekly      Social History     Substance and Sexual Activity   Drug Use No        Immunization History   Administered Date(s) Administered   . COVID-19 Richardo Chandler) 03/19/2020, 04/16/2020         CURRENT  MEDICATIONS:  Current Outpatient Medications on File Prior to Visit   Medication Sig Dispense Refill   . [DISCONTINUED] aspirin  325 MG tablet Take 325 mg by mouth daily.      . aspirin  81 MG tablet Take 81 mg by mouth daily.     . [DISCONTINUED] atorvastatin  (LIPITOR) 40 MG tablet Take 1 tablet by mouth once daily 90 tablet 0   . [DISCONTINUED] atorvastatin  (LIPITOR) 40 MG tablet Take 1 tablet (40 mg) by mouth daily. 90 tablet 0   . [DISCONTINUED] betamethasone  dipropionate (DIPROLENE ) 0.05 % cream Apply 1 Application topically 2 times daily. Use a dime sized amount as directed 1 Tube 1   . chlorthalidone  (HYGROTEN) 50 MG tablet Take 1 tablet by mouth once daily 90 tablet 0   . clotrimazole -betamethasone  (LOTRISONE ) 1-0.05 % cream Apply 1 Application topically 2 times daily. Use a small amount as directed 1 Tube 0   . [DISCONTINUED] ketoconazole  (NIZORAL ) 2 % cream Apply 1 Application topically daily. Use a dime sized amount as directed 1 Tube 2   . [DISCONTINUED] levothyroxine  (SYNTHROID ) 50 MCG tablet TAKE 1 TABLET BY MOUTH ONCE DAILY IN THE MORNING BEFORE BREAKFAST 90 tablet 0   . [DISCONTINUED] levothyroxine  (SYNTHROID ) 50 MCG tablet Take 1 tablet (50 mcg) by mouth every morning (before breakfast). 90 tablet 0   . metoprolol  succinate (TOPROL  XL) 200 MG Extended-Release tablet Take 1 tablet (200 mg) by mouth daily. 90 tablet 2   . Misc Natural Products (TART CHERRY ADVANCED PO) Take by mouth 3 times daily.     . potassium chloride  (KLOR-CON  M20) 20  MEQ tablet Take 1 tablet (20 mEq) by mouth daily. 90 tablet 2     No current facility-administered medications on file prior to visit.      Outpatient Medications Marked as Taking for the 04/23/20 encounter (Office Visit) with Garnet Chatmon, NP   Medication Sig Dispense Refill   . aspirin  81 MG tablet Take 81 mg by mouth daily.     . atorvastatin  (LIPITOR) 40 MG tablet Take 1 tablet (40 mg) by mouth daily. 90 tablet 0   . chlorthalidone  (HYGROTEN) 50 MG tablet Take 1 tablet by mouth once daily 90 tablet 0   . clotrimazole -betamethasone  (LOTRISONE ) 1-0.05 % cream Apply 1 Application topically 2 times daily. Use a small amount as directed  1 Tube 0   . levothyroxine  (SYNTHROID ) 50 MCG tablet Take 1 tablet (50 mcg) by mouth every morning (before breakfast). 90 tablet 0   . metoprolol  succinate (TOPROL  XL) 200 MG Extended-Release tablet Take 1 tablet (200 mg) by mouth daily. 90 tablet 2   . Misc Natural Products (TART CHERRY ADVANCED PO) Take by mouth 3 times daily.     . potassium chloride  (KLOR-CON  M20) 20 MEQ tablet Take 1 tablet (20 mEq) by mouth daily. 90 tablet 2        ALLERGIES:    No Known Allergies       REVIEW OF SYSTEMS:  Review of Systems   All other systems reviewed and are negative.    Refer to HPI          PHYSICAL EXAM:   04/23/20  0909   BP: 132/82   Pulse: 66   Resp: 18   Temp: 97.8 F (36.6 C)     Body mass index is 41.61 kg/m.    Ht Readings from Last 1 Encounters:   04/23/20 6\' 1"  (1.854 m)     Wt Readings from Last 1 Encounters:   04/23/20 143.1 kg (315 lb 6.4 oz)           Physical Exam  Vitals signs and nursing note reviewed.   Constitutional:       Appearance: Normal appearance. He is well-developed.   HENT:      Head: Normocephalic and atraumatic.   Eyes:      Extraocular Movements: Extraocular movements intact.      Conjunctiva/sclera: Conjunctivae normal.   Neck:      Musculoskeletal: Normal range of motion.   Cardiovascular:      Rate and Rhythm: Normal rate and regular rhythm.   Pulmonary:      Effort: Pulmonary effort is normal.      Breath sounds: Normal breath sounds.   Abdominal:      Palpations: Abdomen is soft.   Musculoskeletal: Normal range of motion.   Skin:     General: Skin is warm.   Neurological:      Mental Status: He is alert and oriented to person, place, and time.   Psychiatric:         Mood and Affect: Mood normal.         Behavior: Behavior normal.         Thought Content: Thought content normal.         Judgment: Judgment normal.                  ASSESSMENT & PLAN:    Avraham was seen today for medication review.    Diagnoses and all orders for this visit:    Hyperlipidemia, unspecified hyperlipidemia  type  Assessment & Plan:  Active  Labs ordered  Cont Atorvastatin  40 mg take ad  Advised high fiber diet and daily exercise  Follow up prn results     Orders:  -     Lipid Panel Green Plasma Separator Tube; Future  -     CMP; Future  -     atorvastatin  (LIPITOR) 40 MG tablet; Take 1 tablet (40 mg) by mouth daily.    Hypothyroidism, unspecified type  Assessment & Plan:  Stable  Labs ordered  Cont levothyroxine  50 mcg take ad  Refilled medication   Follow up prn results     Orders:  -     TSH w/ Reflex to FT4; Future  -     levothyroxine  (SYNTHROID ) 50 MCG tablet; Take 1 tablet (50 mcg) by mouth every morning (before breakfast).    Screening for colon cancer  Assessment & Plan:  Stable  Labs ordered   Follow up prn results      Orders:  -     Stool Immunochemical Occult Blood; Future    Morbid obesity (CMS-HCC)  Assessment & Plan:  Active  Body mass index is 41.61 kg/m.  Labs ordered  Advised daily exercise and high fiber diet  Cont to monitor  Follow up prn     Orders:  -     Lipid Panel Green Plasma Separator Tube; Future  -     CMP; Future  -     TSH w/ Reflex to FT4; Future  -     CBC w/ Diff Lavender; Future    Essential hypertension  Assessment & Plan:  Stable  Cont BP medication as listed  Labs ordered  Advised low sodium and wt loss  Stay well hydrated  Follow up prn           ICD-10-CM ICD-9-CM    1. Hyperlipidemia, unspecified hyperlipidemia type  E78.5 272.4 Lipid Panel Green Plasma Separator Tube      CMP      atorvastatin  (LIPITOR) 40 MG tablet   2. Hypothyroidism, unspecified type  E03.9 244.9 TSH w/ Reflex to FT4      levothyroxine  (SYNTHROID ) 50 MCG tablet   3. Screening for colon cancer  Z12.11 V76.51 Stool Immunochemical Occult Blood   4. Morbid obesity (CMS-HCC)  E66.01 278.01 Lipid Panel Green Plasma Separator Tube      CMP      TSH w/ Reflex to FT4      CBC w/ Diff Lavender   5. Essential hypertension  I10 401.9              Alexandra Ang, NP  [facility]  [email or web address]

## 2020-04-26 ENCOUNTER — Encounter (INDEPENDENT_AMBULATORY_CARE_PROVIDER_SITE_OTHER): Payer: Self-pay | Admitting: Nurse Practitioner

## 2020-04-26 NOTE — Assessment & Plan Note (Signed)
 Active  Body mass index is 41.61 kg/m.  Labs ordered  Advised daily exercise and high fiber diet  Cont to monitor  Follow up prn

## 2020-04-26 NOTE — Assessment & Plan Note (Signed)
 Stable  Cont BP medication as listed  Labs ordered  Advised low sodium and wt loss  Stay well hydrated  Follow up prn

## 2020-04-26 NOTE — Assessment & Plan Note (Signed)
 Active  Labs ordered  Cont Atorvastatin  40 mg take ad  Advised high fiber diet and daily exercise  Follow up prn results

## 2020-04-26 NOTE — Assessment & Plan Note (Signed)
 Stable  Labs ordered   Follow up prn results

## 2020-04-26 NOTE — Assessment & Plan Note (Signed)
 Stable  Labs ordered  Cont levothyroxine  50 mcg take ad  Refilled medication   Follow up prn results

## 2020-05-09 LAB — CBC WITH DIFF, BLOOD
Abs Basophils: 30 cells/uL (ref 0–200)
Abs Eosinophils: 230 cells/uL (ref 15–500)
Abs Lymphs: 1599 cells/uL (ref 850–3900)
Abs Monocytes: 773 cells/uL (ref 200–950)
Abs Neutrophils: 3269 cells/uL (ref 1500–7800)
Basophils: 0.5 %
Eosinophils: 3.9 %
HCT: 40.8 % (ref 38.5–50.0)
HGB: 14.3 g/dL (ref 13.2–17.1)
Lymps: 27.1 %
MCH: 32.9 pg (ref 27.0–33.0)
MCHC: 35 g/dL (ref 32.0–36.0)
MCV: 93.8 fL (ref 80.0–100.0)
MPV: 11.1 fL (ref 7.5–12.5)
Monocytes: 13.1 %
PLT: 272 10*3/uL (ref 140–400)
RBC: 4.35 10*6/uL (ref 4.20–5.80)
RDW: 12.7 % (ref 11.0–15.0)
SEGS: 55.4 %
WBC: 5.9 10*3/uL (ref 3.8–10.8)

## 2020-05-09 LAB — COMPREHENSIVE METABOLIC PANEL, BLOOD
ALT (SGPT): 28 U/L (ref 9–46)
AST (SGOT): 31 U/L (ref 10–35)
Albumin/Glob Ratio: 1.3 (calc) (ref 1.0–2.5)
Albumin: 4 g/dL (ref 3.6–5.1)
Alkaline Phos: 88 U/L (ref 35–144)
BUN: 17 mg/dL (ref 7–25)
Bilirubin, Total: 0.8 mg/dL (ref 0.2–1.2)
Calcium: 9.2 mg/dL (ref 8.6–10.3)
Carbon Dioxide: 30 mmol/L (ref 20–32)
Chloride: 98 mmol/L (ref 98–110)
Creatinine: 1.03 mg/dL (ref 0.70–1.25)
Globulin: 3.2 g/dL (calc) (ref 1.9–3.7)
Glucose: 134 mg/dL — ABNORMAL HIGH (ref 65–99)
Potassium: 3.7 mmol/L (ref 3.5–5.3)
Sodium: 138 mmol/L (ref 135–146)
Total Protein: 7.2 g/dL (ref 6.1–8.1)
eGFR African American: 89 mL/min/{1.73_m2} (ref >=60)
eGFR non-Afr.American: 77 mL/min/{1.73_m2} (ref >=60)

## 2020-05-09 LAB — LIPID(CHOL FRACT) PANEL, BLOOD
Chol/HDLC Ratio: 3 (calc) (ref <5.0)
Cholesterol: 120 mg/dL (ref <200)
HDL Cholesterol: 40 mg/dL (ref >=40)
LDL-Cholesterol: 63 mg/dL (calc)
Non-HDL Cholesterol: 80 mg/dL (calc) (ref <130)
Triglycerides: 91 mg/dL (ref <150)

## 2020-05-09 LAB — TSH W/REFLEX TO FT4-QUEST: TSH: 6.43 mIU/L — ABNORMAL HIGH (ref 0.40–4.50)

## 2020-05-09 LAB — FREE THYROXINE, BLOOD: Free T4: 1 ng/dL (ref 0.8–1.8)

## 2020-05-15 ENCOUNTER — Other Ambulatory Visit (INDEPENDENT_AMBULATORY_CARE_PROVIDER_SITE_OTHER): Payer: Self-pay | Admitting: Family Medicine

## 2020-05-15 DIAGNOSIS — I10 Essential (primary) hypertension: Secondary | ICD-10-CM

## 2020-05-15 MED ORDER — METOPROLOL SUCCINATE 200 MG OR TB24
ORAL_TABLET | ORAL | 2 refills | Status: DC
Start: 2020-05-15 — End: 2020-11-06

## 2020-05-19 ENCOUNTER — Telehealth (INDEPENDENT_AMBULATORY_CARE_PROVIDER_SITE_OTHER): Payer: Self-pay | Admitting: Nurse Practitioner

## 2020-05-19 NOTE — Telephone Encounter (Addendum)
 Pt scheduled for f/u appt to discuss labs with Treasa Friend on 05/25/20 at 8:50am    ----- Message from Alexandra Ang, NP sent at 05/10/2020 11:33 AM PDT -----  Please schedule appt to discuss lab results.

## 2020-05-25 ENCOUNTER — Encounter (INDEPENDENT_AMBULATORY_CARE_PROVIDER_SITE_OTHER): Payer: Self-pay | Admitting: Nurse Practitioner

## 2020-05-25 ENCOUNTER — Telehealth (INDEPENDENT_AMBULATORY_CARE_PROVIDER_SITE_OTHER): Admitting: Nurse Practitioner

## 2020-05-25 VITALS — Ht 73.0 in | Wt 315.0 lb

## 2020-05-25 NOTE — Progress Notes (Signed)
 [name]  [place] - [place]-  [place] - [place] - [place]   [email or web address] and [email or web address]  Telephone: [phone]                 Encounter Date:  05/25/20  9:08 AM   PCP: Stanford Earl   MRN: [id number]  DOB: 10/05/1956    [facility] TELEPHONE SERVICES ENCOUNTER  Pandemic Response Ambulatory Protocol    Evaluator(s):   Chanse Kagel is a 64 year old male  who was evaluated by: Nurse Practitioner (via telemedicine)    Statement:    A Relevant History (including allergies, medications, past medical history, relevant review of systems) and relevant exam as performed by the named provider, are as transcribed in this and/or the accompanying note. Please also see the patient questionnaire for details.    Patient Verification & Telemedicine Consent:      -I have verified that the patient's identification to be correct via verbal confirmation of birth date and address & valid: Yes    -The patient, and / or surrogate, has been made aware that patient is to be evaluated today using a home telemedicine visit technique (audio transmission): Yes    - The patient, and / or surrogate, has been made aware that patient has the right to refuse this type of evaluation at any time during the assessment period, and has been made aware of any alternatives to this type of evaluation: Yes    -The patient, and / or surrogate,  has been made aware that patient may need further evaluations in the future: Yes    -The patient, and / or surrogate, has signed a valid Informed Consent document (detailing risks, benefits, alternatives & costs), or is exempt from these requirements by law, which I verify is currently present in the [place] MEDICAL RECORD [id number]    Patient is unable to be seen by a specialist in this clinic because:      Pandemic response ambulatory protocol.  Due to COVID-19 pandemic and a federally declared state of public health emergency, this service is being  conducted via telephone. Patient is at risk.                HPI:  Juan Guerrero is a 64 year old male presents at Home for the following:   Chief Complaint   Patient presents with   . Results     64 yo male present via telehealth to f/u on labs      Telephone appointment  Patient called to review labs  Elevated glucose and TSH noted  Stated drinks Coke Zero    PROBLEM  LIST:  Patient Active Problem List   Diagnosis   . Essential hypertension   . Hypothyroidism   . Hyperlipidemia   . Tinea corporis   . H/O bilateral hip replacements   . Elevated blood sugar   . Acute gout involving toe of right foot, unspecified cause   . Acute pain of right knee   . Morbid obesity (CMS-HCC)   . Screening for colon cancer         PAST MEDICAL HISTORY:  Past Medical History:   Diagnosis Date   . Back pain    . Hypertension          SOCIAL HISTORY:  Social History     Socioeconomic History   . Marital status: Married     Spouse name: Not on file   . Number of children: Not  on file   . Years of education: Not on file   . Highest education level: Not on file   Occupational History   . Not on file   Tobacco Use   . Smoking status: Former Games developer   . Smokeless tobacco: Never Used   Substance and Sexual Activity   . Alcohol use: Yes   . Drug use: No   . Sexual activity: Not on file   Other Topics Concern   . Military Service Not Asked   . Blood Transfusions Not Asked   . Caffeine Concern No   . Occupational Exposure Not Asked   . Hobby Hazards Not Asked   . Sleep Concern Not Asked   . Stress Concern Not Asked   . Weight Concern Not Asked   . Special Diet No   . Back Care Not Asked   . Exercises Regularly Yes   . Bike Helmet Use Not Asked   . Seat Belt Use Yes   . Performs Self-Exams Not Asked   Social History Narrative   . Not on file     Social Determinants of Health     Financial Resource Strain:    . Difficulty of Paying Living Expenses:    Food Insecurity:    . Worried About Programme researcher, broadcasting/film/video in the Last Year:    . Barista in  the Last Year:    Transportation Needs:    . Freight forwarder (Medical):    Aaron Aas Lack of Transportation (Non-Medical):    Physical Activity: Insufficiently Active   . Days of Exercise per Week: 2 days   . Minutes of Exercise per Session: 30 min   Stress:    . Feeling of Stress :    Social Connections:    . Frequency of Communication with Friends and Family:    . Frequency of Social Gatherings with Friends and Family:    . Attends Religious Services:    . Active Member of Clubs or Organizations:    . Attends Banker Meetings:    Aaron Aas Marital Status:    Intimate Partner Violence:    . Fear of Current or Ex-Partner:    . Emotionally Abused:    Aaron Aas Physically Abused:    . Sexually Abused:      Social History     Tobacco Use   Smoking Status Former Smoker   Smokeless Tobacco Never Used      Social History     Substance and Sexual Activity   Alcohol Use Yes      Social History     Substance and Sexual Activity   Drug Use No        Immunization History   Administered Date(s) Administered   . COVID-19 Richardo Chandler) 03/19/2020, 04/16/2020         CURRENT  MEDICATIONS:  Current Outpatient Medications on File Prior to Visit   Medication Sig Dispense Refill   . aspirin  81 MG tablet Take 81 mg by mouth daily.     . atorvastatin  (LIPITOR) 40 MG tablet Take 1 tablet (40 mg) by mouth daily. 90 tablet 0   . chlorthalidone  (HYGROTEN) 50 MG tablet Take 1 tablet by mouth once daily 90 tablet 0   . clotrimazole -betamethasone  (LOTRISONE ) 1-0.05 % cream Apply 1 Application topically 2 times daily. Use a small amount as directed 1 Tube 0   . levothyroxine  (SYNTHROID ) 50 MCG tablet Take 1 tablet (50 mcg) by mouth every morning (before  breakfast). 90 tablet 0   . metoprolol  succinate (TOPROL  XL) 200 MG Extended-Release tablet Take 1 tablet by mouth once daily 90 tablet 2   . Misc Natural Products (TART CHERRY ADVANCED PO) Take by mouth 3 times daily.     . potassium chloride  (KLOR-CON  M20) 20 MEQ tablet Take 1 tablet (20 mEq) by mouth  daily. 90 tablet 2     No current facility-administered medications on file prior to visit.     Outpatient Medications Marked as Taking for the 05/25/20 encounter (Non-Face-to-Face) with Quamesha Mullet, NP   Medication Sig Dispense Refill   . aspirin  81 MG tablet Take 81 mg by mouth daily.     . atorvastatin  (LIPITOR) 40 MG tablet Take 1 tablet (40 mg) by mouth daily. 90 tablet 0   . chlorthalidone  (HYGROTEN) 50 MG tablet Take 1 tablet by mouth once daily 90 tablet 0   . clotrimazole -betamethasone  (LOTRISONE ) 1-0.05 % cream Apply 1 Application topically 2 times daily. Use a small amount as directed 1 Tube 0   . levothyroxine  (SYNTHROID ) 50 MCG tablet Take 1 tablet (50 mcg) by mouth every morning (before breakfast). 90 tablet 0   . metoprolol  succinate (TOPROL  XL) 200 MG Extended-Release tablet Take 1 tablet by mouth once daily 90 tablet 2   . Misc Natural Products (TART CHERRY ADVANCED PO) Take by mouth 3 times daily.     . potassium chloride  (KLOR-CON  M20) 20 MEQ tablet Take 1 tablet (20 mEq) by mouth daily. 90 tablet 2        ALLERGIES:    No Known Allergies       REVIEW OF SYSTEMS:  Review of Systems   All other systems reviewed and are negative.    Refer to HPI            PHYSICAL EXAM:    There were no vitals filed for this visit.  Body mass index is 41.56 kg/m.    Ht Readings from Last 1 Encounters:   05/25/20 6\' 1"  (1.854 m)     Wt Readings from Last 1 Encounters:   05/25/20 142.9 kg (315 lb)       General Impression: No physical exam done. Sounds healthy, alert, no distress, pleasant affect, cooperative, communicative. Speech within normal limits.              ASSESSMENT & PLAN:    Kaileb was seen today for results.    Diagnoses and all orders for this visit:    Elevated blood sugar  Assessment & Plan:  Active  Lab Results   Component Value Date    GLU 134 (H) 05/08/2020   A1c ordered  Discussed diabetes, diet, exercise and wt loss  Dicussed Metformin 500 mg   Follow up prn results       Orders:  -      Glycosylated Hgb(A1C), Blood Lavender; Future    Hypothyroidism, unspecified type  Assessment & Plan:  Active  Lab Results   Component Value Date    TSH 6.43 (H) 05/08/2020   repeat TSH ordered  Cont Levothyroxine  50 mcg take ad  Consider adjusting medication prn results  Follow up prn results         Orders:  -     TSH w/ Reflex to FT4; Future        ICD-10-CM ICD-9-CM    1. Elevated blood sugar  R73.9 790.29 Glycosylated Hgb(A1C), Blood Lavender   2. Hypothyroidism, unspecified type  E03.9 244.9 TSH w/  Reflex to FT4       Patient instructions:  See EPIC instructions.  Reviewed verbally and/or AVS available via MYchart for patient.      Barriers to learning assessed: None.    Patient/family verbalizes understanding and is agreeable to above plan.    Patient was evaluated by Lindaann Requena  at [facility] [place]      TIME SPENT ON MEDICAL DISCUSSION INCLUDING BEFORE, DURING, AND AFTER PHONE CONVERSATION: 10 min   Start time: 0908  Stop time: 0918      [facility]  [email or web address]    [phone]

## 2020-05-25 NOTE — Assessment & Plan Note (Signed)
 Active  Lab Results   Component Value Date    GLU 134 (H) 05/08/2020   A1c ordered  Discussed diabetes, diet, exercise and wt loss  Dicussed Metformin 500 mg   Follow up prn results

## 2020-05-25 NOTE — Assessment & Plan Note (Signed)
 Active  Lab Results   Component Value Date    TSH 6.43 (H) 05/08/2020   repeat TSH ordered  Cont Levothyroxine  50 mcg take ad  Consider adjusting medication prn results  Follow up prn results

## 2020-05-30 ENCOUNTER — Other Ambulatory Visit (INDEPENDENT_AMBULATORY_CARE_PROVIDER_SITE_OTHER): Payer: Self-pay | Admitting: Nurse Practitioner

## 2020-05-30 DIAGNOSIS — I10 Essential (primary) hypertension: Secondary | ICD-10-CM

## 2020-05-31 MED ORDER — CHLORTHALIDONE 50 MG OR TABS
ORAL_TABLET | ORAL | 3 refills | Status: DC
Start: 2020-05-31 — End: 2021-05-28

## 2020-07-17 ENCOUNTER — Other Ambulatory Visit (INDEPENDENT_AMBULATORY_CARE_PROVIDER_SITE_OTHER): Payer: Self-pay | Admitting: Nurse Practitioner

## 2020-07-18 ENCOUNTER — Other Ambulatory Visit (INDEPENDENT_AMBULATORY_CARE_PROVIDER_SITE_OTHER): Payer: Self-pay | Admitting: Nurse Practitioner

## 2020-07-20 ENCOUNTER — Encounter (INDEPENDENT_AMBULATORY_CARE_PROVIDER_SITE_OTHER): Payer: Self-pay | Admitting: Nurse Practitioner

## 2020-07-22 ENCOUNTER — Other Ambulatory Visit (INDEPENDENT_AMBULATORY_CARE_PROVIDER_SITE_OTHER): Payer: Self-pay | Admitting: Nurse Practitioner

## 2020-07-22 DIAGNOSIS — E785 Hyperlipidemia, unspecified: Secondary | ICD-10-CM

## 2020-07-23 MED ORDER — ATORVASTATIN CALCIUM 40 MG OR TABS
40.0000 mg | ORAL_TABLET | Freq: Every day | ORAL | 0 refills | Status: DC
Start: 2020-07-23 — End: 2020-08-25

## 2020-07-30 ENCOUNTER — Other Ambulatory Visit (INDEPENDENT_AMBULATORY_CARE_PROVIDER_SITE_OTHER): Payer: Self-pay | Admitting: Nurse Practitioner

## 2020-07-30 DIAGNOSIS — E039 Hypothyroidism, unspecified: Secondary | ICD-10-CM

## 2020-07-31 MED ORDER — LEVOTHYROXINE SODIUM 75 MCG OR TABS
75.00 ug | ORAL_TABLET | Freq: Every day | ORAL | 0 refills | Status: DC
Start: 2020-07-31 — End: 2020-08-25

## 2020-08-21 ENCOUNTER — Other Ambulatory Visit (INDEPENDENT_AMBULATORY_CARE_PROVIDER_SITE_OTHER): Payer: Self-pay | Admitting: Nurse Practitioner

## 2020-08-21 DIAGNOSIS — E785 Hyperlipidemia, unspecified: Secondary | ICD-10-CM

## 2020-08-21 NOTE — Telephone Encounter (Signed)
 Patient requesting refill on     atorvastatin  (LIPITOR) 40 MG tablet    LOV 05/25/20    Patient states needing medication for   Weekend not many left.

## 2020-08-22 ENCOUNTER — Other Ambulatory Visit (INDEPENDENT_AMBULATORY_CARE_PROVIDER_SITE_OTHER): Payer: Self-pay | Admitting: Nurse Practitioner

## 2020-08-22 DIAGNOSIS — E039 Hypothyroidism, unspecified: Secondary | ICD-10-CM

## 2020-08-22 LAB — GLYCOSYLATED HGB(A1C), BLOOD: Hgb A1C: 6.1 % of total Hgb — ABNORMAL HIGH (ref <5.7)

## 2020-08-22 LAB — TSH W/REFLEX TO FT4-QUEST: TSH: 3.64 mIU/L (ref 0.40–4.50)

## 2020-08-23 ENCOUNTER — Other Ambulatory Visit (INDEPENDENT_AMBULATORY_CARE_PROVIDER_SITE_OTHER): Payer: Self-pay | Admitting: Nurse Practitioner

## 2020-08-23 DIAGNOSIS — E785 Hyperlipidemia, unspecified: Secondary | ICD-10-CM

## 2020-08-25 ENCOUNTER — Encounter (INDEPENDENT_AMBULATORY_CARE_PROVIDER_SITE_OTHER): Payer: Self-pay | Admitting: Nurse Practitioner

## 2020-08-25 MED ORDER — EUTHYROX 75 MCG PO TABS
ORAL_TABLET | ORAL | 3 refills | Status: DC
Start: 2020-08-25 — End: 2021-10-04

## 2020-08-25 MED ORDER — ATORVASTATIN CALCIUM 40 MG OR TABS
ORAL_TABLET | ORAL | 3 refills | Status: DC
Start: 2020-08-25 — End: 2021-08-24

## 2020-08-28 ENCOUNTER — Encounter (INDEPENDENT_AMBULATORY_CARE_PROVIDER_SITE_OTHER): Payer: Self-pay | Admitting: Hospital

## 2020-08-31 ENCOUNTER — Other Ambulatory Visit (INDEPENDENT_AMBULATORY_CARE_PROVIDER_SITE_OTHER): Payer: Self-pay | Admitting: Nurse Practitioner

## 2020-08-31 DIAGNOSIS — M109 Gout, unspecified: Secondary | ICD-10-CM

## 2020-08-31 MED ORDER — COLCHICINE 0.6 MG OR TABS
0.60 mg | ORAL_TABLET | Freq: Every day | ORAL | Status: DC
Start: ? — End: 2020-09-02

## 2020-08-31 NOTE — Telephone Encounter (Signed)
 Pt called requesting a refill on his Colchine 0.6 mg     Pt states his gout has been flaring up for 1 week.    Please advise if ok to refill or if pt needs to come in for an appt.     Thank you.     Pt will like a cb [phone]

## 2020-09-02 ENCOUNTER — Encounter (INDEPENDENT_AMBULATORY_CARE_PROVIDER_SITE_OTHER): Payer: Self-pay | Admitting: Nurse Practitioner

## 2020-09-02 DIAGNOSIS — M109 Gout, unspecified: Secondary | ICD-10-CM

## 2020-09-02 MED ORDER — COLCHICINE 0.6 MG OR TABS
ORAL_TABLET | ORAL | 0 refills | Status: DC
Start: 2020-09-02 — End: 2021-06-02

## 2020-09-02 MED ORDER — METHYLPREDNISOLONE 4 MG OR KIT
ORAL_TABLET | ORAL | 0 refills | Status: DC
Start: 2020-09-02 — End: 2021-07-21

## 2020-10-05 ENCOUNTER — Encounter (INDEPENDENT_AMBULATORY_CARE_PROVIDER_SITE_OTHER): Payer: Self-pay | Admitting: Nurse Practitioner

## 2020-10-05 NOTE — Telephone Encounter (Signed)
 From: Atha Lazar  To: Alexandra Ang, NP  Sent: 10/05/2020 11:18 AM PDT  Subject: Hearing referral    Last time I visited with Dr. Irving Mantle she suggested a hearing referral and I said I would wait. But after further review I would like to get my ears checked so can I get that referral now Please.    Thanks Atha Lazar

## 2020-10-06 ENCOUNTER — Encounter (INDEPENDENT_AMBULATORY_CARE_PROVIDER_SITE_OTHER): Payer: Self-pay | Admitting: Hospital

## 2020-11-06 ENCOUNTER — Other Ambulatory Visit (INDEPENDENT_AMBULATORY_CARE_PROVIDER_SITE_OTHER): Payer: Self-pay | Admitting: Family Medicine

## 2020-11-08 MED ORDER — POTASSIUM CHLORIDE CRYS CR 20 MEQ OR TBCR
20.0000 meq | EXTENDED_RELEASE_TABLET | Freq: Every day | ORAL | 2 refills | Status: DC
Start: 2020-11-08 — End: 2021-08-07

## 2020-11-08 MED ORDER — METOPROLOL SUCCINATE 200 MG OR TB24
200.0000 mg | ORAL_TABLET | Freq: Every day | ORAL | 2 refills | Status: DC
Start: 2020-11-08 — End: 2021-08-07

## 2020-11-27 ENCOUNTER — Encounter (INDEPENDENT_AMBULATORY_CARE_PROVIDER_SITE_OTHER): Payer: Self-pay | Admitting: Family Medicine

## 2020-11-27 DIAGNOSIS — B354 Tinea corporis: Secondary | ICD-10-CM

## 2020-11-30 MED ORDER — KETOCONAZOLE 2 % EX CREA
1.0000 | TOPICAL_CREAM | Freq: Every day | CUTANEOUS | 1 refills | Status: AC
Start: 2020-11-30 — End: ?

## 2020-11-30 NOTE — Telephone Encounter (Signed)
 From: Ronny Flurry  To: Zannie Kehr, MD  Sent: 11/27/2020 10:49 AM PST  Subject: Prescription renewal    My skin doctor says he left [place] in September, Dr. Everlene Balls. So can Dr, Lorrene Reid ok the prescription renewal. The prescription is Ketoconazole Cream 2% . Thanks and hope to hear from you soon.  Ronny Flurry

## 2021-01-11 ENCOUNTER — Encounter (INDEPENDENT_AMBULATORY_CARE_PROVIDER_SITE_OTHER): Payer: Self-pay | Admitting: Family Medicine

## 2021-01-11 DIAGNOSIS — N529 Male erectile dysfunction, unspecified: Secondary | ICD-10-CM

## 2021-01-12 MED ORDER — SILDENAFIL CITRATE 50 MG OR TABS
50.0000 mg | ORAL_TABLET | ORAL | 0 refills | Status: AC | PRN
Start: 2021-01-12 — End: ?

## 2021-01-12 NOTE — Telephone Encounter (Signed)
 From: Atha Lazar  To: Stanford Earl, MD  Sent: 01/11/2021 2:51 PM PST  Subject: ED    Juan Guerrero,  How is it going? Say I have erectile dysfuntion and I am looking for a prescription that could help me. I will be 65 in a month and we would like to celebrate. Thanks [employer]. My doctor in [place]  gave me a Viagra prescription, but I never used it. So will you be able to help and any questions feel free to ask. Thanks and hope to hear from you soon.  Atha Lazar

## 2021-03-10 ENCOUNTER — Encounter (INDEPENDENT_AMBULATORY_CARE_PROVIDER_SITE_OTHER): Payer: Self-pay

## 2021-03-11 ENCOUNTER — Encounter (INDEPENDENT_AMBULATORY_CARE_PROVIDER_SITE_OTHER): Payer: Self-pay

## 2021-03-15 ENCOUNTER — Encounter (INDEPENDENT_AMBULATORY_CARE_PROVIDER_SITE_OTHER): Payer: Self-pay | Admitting: Family Medicine

## 2021-03-17 ENCOUNTER — Encounter (INDEPENDENT_AMBULATORY_CARE_PROVIDER_SITE_OTHER): Payer: Self-pay | Admitting: Family Medicine

## 2021-03-22 NOTE — Telephone Encounter (Signed)
 Has not been seen in almost 1 year. Please schedule appt for [employer] form completion

## 2021-03-22 NOTE — Telephone Encounter (Signed)
 From: Ronny Flurry  To: Zannie Kehr, MD  Sent: 03/17/2021 7:11 PM PDT  Subject: [employer] Fax did you receive it? Should have been sent on 03/12/21. Thanks    Haven"t heard from Doctor Combs if he has received the fax from [employer] for some information on me. Can you give me idea whats going on. Thanks Juan Guerrero

## 2021-03-23 NOTE — Telephone Encounter (Signed)
 Pt reached. States he will no longer go forward with the [employer] blood donation. Declined appt at this time

## 2021-05-28 ENCOUNTER — Other Ambulatory Visit (INDEPENDENT_AMBULATORY_CARE_PROVIDER_SITE_OTHER): Payer: Self-pay | Admitting: Nurse Practitioner

## 2021-05-28 DIAGNOSIS — I10 Essential (primary) hypertension: Secondary | ICD-10-CM

## 2021-05-28 MED ORDER — CHLORTHALIDONE 50 MG OR TABS
ORAL_TABLET | ORAL | 0 refills | Status: DC
Start: 2021-05-28 — End: 2021-06-28

## 2021-05-28 NOTE — Telephone Encounter (Signed)
 Please notify patient that he is due for appointment. I only gave a 30 day supply. He should be seen for BP every 6 months

## 2021-05-28 NOTE — Telephone Encounter (Signed)
 LVM (1)

## 2021-05-31 NOTE — Telephone Encounter (Signed)
 Pt notified and scheduled.

## 2021-06-02 ENCOUNTER — Other Ambulatory Visit (INDEPENDENT_AMBULATORY_CARE_PROVIDER_SITE_OTHER): Payer: Self-pay | Admitting: Nurse Practitioner

## 2021-06-02 ENCOUNTER — Ambulatory Visit (INDEPENDENT_AMBULATORY_CARE_PROVIDER_SITE_OTHER): Admitting: Family Medicine

## 2021-06-02 DIAGNOSIS — M109 Gout, unspecified: Secondary | ICD-10-CM

## 2021-06-02 MED ORDER — COLCHICINE 0.6 MG OR TABS
ORAL_TABLET | ORAL | 0 refills | Status: DC
Start: 2021-06-02 — End: 2021-07-12

## 2021-06-02 NOTE — Telephone Encounter (Signed)
 PLEASE ORDER UPDATED URIC ACID LEVEL FOR PATIENT TO COMPLETE PRIOR TO UPCOMING APPT

## 2021-06-28 ENCOUNTER — Other Ambulatory Visit (INDEPENDENT_AMBULATORY_CARE_PROVIDER_SITE_OTHER): Payer: Self-pay | Admitting: Nurse Practitioner

## 2021-06-28 DIAGNOSIS — I10 Essential (primary) hypertension: Secondary | ICD-10-CM

## 2021-06-28 MED ORDER — CHLORTHALIDONE 50 MG OR TABS
50.0000 mg | ORAL_TABLET | Freq: Every day | ORAL | 0 refills | Status: DC
Start: 2021-06-28 — End: 2021-07-24

## 2021-06-29 ENCOUNTER — Encounter (INDEPENDENT_AMBULATORY_CARE_PROVIDER_SITE_OTHER): Payer: Self-pay | Admitting: Nurse Practitioner

## 2021-06-29 ENCOUNTER — Ambulatory Visit (INDEPENDENT_AMBULATORY_CARE_PROVIDER_SITE_OTHER): Admitting: Nurse Practitioner

## 2021-06-29 VITALS — BP 152/78 | HR 82 | Temp 97.2°F | Resp 16

## 2021-06-29 MED ORDER — CLOTRIMAZOLE-BETAMETHASONE 1-0.05 % EX CREA
1.0000 | TOPICAL_CREAM | Freq: Two times a day (BID) | CUTANEOUS | 0 refills | Status: AC
Start: 2021-06-29 — End: ?

## 2021-06-29 MED ORDER — AMLODIPINE 5 MG OR TABS
5.0000 mg | ORAL_TABLET | Freq: Every day | ORAL | 3 refills | Status: AC
Start: 2021-06-29 — End: ?

## 2021-07-01 ENCOUNTER — Encounter (INDEPENDENT_AMBULATORY_CARE_PROVIDER_SITE_OTHER): Payer: Self-pay | Admitting: Nurse Practitioner

## 2021-07-02 ENCOUNTER — Encounter (INDEPENDENT_AMBULATORY_CARE_PROVIDER_SITE_OTHER): Payer: Self-pay | Admitting: Hospital

## 2021-07-05 ENCOUNTER — Encounter (INDEPENDENT_AMBULATORY_CARE_PROVIDER_SITE_OTHER): Payer: Self-pay | Admitting: Nurse Practitioner

## 2021-07-05 ENCOUNTER — Encounter (INDEPENDENT_AMBULATORY_CARE_PROVIDER_SITE_OTHER): Payer: Self-pay | Admitting: Hospital

## 2021-07-05 ENCOUNTER — Telehealth (INDEPENDENT_AMBULATORY_CARE_PROVIDER_SITE_OTHER): Payer: Self-pay | Admitting: Nurse Practitioner

## 2021-07-10 LAB — COMPREHENSIVE METABOLIC PANEL, BLOOD
ALT (SGPT): 28 U/L (ref 9–46)
AST (SGOT): 34 U/L (ref 10–35)
Albumin/Glob Ratio: 1.5 (calc) (ref 1.0–2.5)
Albumin: 4.5 g/dL (ref 3.6–5.1)
Alkaline Phos: 81 U/L (ref 35–144)
BUN: 18 mg/dL (ref 7–25)
Bilirubin, Total: 1.3 mg/dL — ABNORMAL HIGH (ref 0.2–1.2)
Calcium: 10.1 mg/dL (ref 8.6–10.3)
Carbon Dioxide: 28 mmol/L (ref 20–32)
Chloride: 99 mmol/L (ref 98–110)
Creatinine: 1.1 mg/dL (ref 0.70–1.35)
EGFR: 75 mL/min/{1.73_m2} (ref >=60)
Globulin: 3.1 g/dL (calc) (ref 1.9–3.7)
Glucose: 122 mg/dL — ABNORMAL HIGH (ref 65–99)
Potassium: 3.6 mmol/L (ref 3.5–5.3)
Sodium: 139 mmol/L (ref 135–146)
Total Protein: 7.6 g/dL (ref 6.1–8.1)

## 2021-07-10 LAB — CBC WITH DIFF, BLOOD
Abs Basophils: 20 cells/uL (ref 0–200)
Abs Eosinophils: 317 cells/uL (ref 15–500)
Abs Lymphs: 1597 cells/uL (ref 850–3900)
Abs Monocytes: 779 cells/uL (ref 200–950)
Abs Neutrophils: 3887 cells/uL (ref 1500–7800)
Basophils: 0.3 %
Eosinophils: 4.8 %
HCT: 42.7 % (ref 38.5–50.0)
HGB: 14.8 g/dL (ref 13.2–17.1)
Lymps: 24.2 %
MCH: 32.5 pg (ref 27.0–33.0)
MCHC: 34.7 g/dL (ref 32.0–36.0)
MCV: 93.8 fL (ref 80.0–100.0)
MPV: 10.6 fL (ref 7.5–12.5)
Monocytes: 11.8 %
PLT: 304 10*3/uL (ref 140–400)
RBC: 4.55 10*6/uL (ref 4.20–5.80)
RDW: 13 % (ref 11.0–15.0)
SEGS: 58.9 %
WBC: 6.6 10*3/uL (ref 3.8–10.8)

## 2021-07-10 LAB — TSH W/REFLEX TO FT4-QUEST: TSH: 3.4 mIU/L (ref 0.40–4.50)

## 2021-07-10 LAB — LIPID(CHOL FRACT) PANEL, BLOOD
Chol/HDLC Ratio: 2.7 (calc) (ref <5.0)
Cholesterol: 128 mg/dL (ref <200)
HDL Cholesterol: 47 mg/dL (ref >=40)
LDL-Cholesterol: 60 mg/dL (calc)
Non-HDL Cholesterol: 81 mg/dL (calc) (ref <130)
Triglycerides: 127 mg/dL (ref <150)

## 2021-07-12 ENCOUNTER — Telehealth (INDEPENDENT_AMBULATORY_CARE_PROVIDER_SITE_OTHER): Payer: Self-pay | Admitting: Nurse Practitioner

## 2021-07-12 ENCOUNTER — Other Ambulatory Visit (INDEPENDENT_AMBULATORY_CARE_PROVIDER_SITE_OTHER): Payer: Self-pay | Admitting: Nurse Practitioner

## 2021-07-12 DIAGNOSIS — M109 Gout, unspecified: Secondary | ICD-10-CM

## 2021-07-13 MED ORDER — COLCHICINE 0.6 MG OR TABS
ORAL_TABLET | ORAL | 0 refills | Status: AC
Start: 2021-07-13 — End: ?

## 2021-07-19 ENCOUNTER — Telehealth (INDEPENDENT_AMBULATORY_CARE_PROVIDER_SITE_OTHER): Payer: Self-pay | Admitting: Nurse Practitioner

## 2021-07-21 ENCOUNTER — Ambulatory Visit (INDEPENDENT_AMBULATORY_CARE_PROVIDER_SITE_OTHER): Admitting: Family

## 2021-07-21 ENCOUNTER — Encounter (INDEPENDENT_AMBULATORY_CARE_PROVIDER_SITE_OTHER): Payer: Self-pay | Admitting: Family

## 2021-07-21 ENCOUNTER — Encounter (INDEPENDENT_AMBULATORY_CARE_PROVIDER_SITE_OTHER): Payer: Self-pay | Admitting: Hospital

## 2021-07-21 VITALS — BP 146/84 | HR 55 | Temp 98.0°F | Resp 16 | Ht 73.0 in | Wt 299.0 lb

## 2021-07-21 MED ORDER — CYCLOBENZAPRINE HCL 10 MG OR TABS
ORAL_TABLET | ORAL | 0 refills | Status: AC
Start: 2021-07-21 — End: ?

## 2021-07-21 MED ORDER — TRAMADOL HCL 50 MG OR TABS
50.0000 mg | ORAL_TABLET | Freq: Four times a day (QID) | ORAL | 0 refills | Status: DC | PRN
Start: 2021-07-21 — End: 2021-08-10

## 2021-07-21 NOTE — Assessment & Plan Note (Addendum)
 Active  Start Tramadol 50 mg prn for pain  Start Flexeril 10 mg nightly prn  STAT MRI lumbar spine ordered  Form filled out for NexWave   Continue to monitor   Will notify pt of results

## 2021-07-21 NOTE — Assessment & Plan Note (Signed)
 Progressing  Continue to monitor   F/u prn

## 2021-07-21 NOTE — Progress Notes (Signed)
 [facility]  [place] - [place]-  [place] - [place] - [place]   [email or web address] and [email or web address]  Telephone: [phone]             Encounter Date:  07/21/21  3:56 PM   PCP: Zannie Kehr  MRN: [id number]  DOB: [birth year]       HPI:  Juan Guerrero is a 65 year old male presents   Chief Complaint   Patient presents with   . Musculoskeletal Problem     Pt c/o right hip pain that radiates to right calf.      Pt c/o right hip pain that radiates to right calf   He has 2 replaced hips  He was given prednisone that didn't have much relief    He is taking 500 mg BID Tylenol every so many hours for pain  When he coughs he has pain  When he sits on the toilet pain shoots down his leg  He has been icing his hip and down  Had a MRI a few years ago  Has spinal stenosis, cleaned out the left side of his vertebrae  Yesterday he went on a long walk and was hurting for two days   He has referral for PT already  Patient stretches at home   He used to ride his bike but isn't able to anymore   If his hips are shifted he has to get up due to the pain      PROBLEM  LIST:  Patient Active Problem List   Diagnosis   . Primary hypertension   . Hypothyroidism   . Hyperlipidemia   . Tinea corporis   . H/O bilateral hip replacements   . Elevated blood sugar   . Acute gout involving toe of right foot, unspecified cause   . Acute pain of right knee   . Morbid obesity (CMS-HCC)   . Screening for colon cancer   . Prediabetes   . Impacted cerumen of left ear   . Decreased hearing of left ear   . Tinnitus of left ear   . Bilateral hip pain   . Chronic right-sided low back pain with right-sided sciatica   . Encounter for medication review   . Spinal stenosis of lumbar region with neurogenic claudication         PAST MEDICAL HISTORY:  Past Medical History:   Diagnosis Date   . Back pain    . Hypertension        PAST SURGICAL HISTORY:  Past Surgical History:   Procedure Laterality Date   . COLONOSCOPY  2018    . HIP ARTHROPLASTY      both        FAMILY HISTORY:   Family History   Problem Relation Name Age of Onset   . Hypertension Mother     . Obesity Mother     . Stroke Mother     . Cholesterol/Lipid Disorder Father           SOCIAL HISTORY:  Social History     Socioeconomic History   . Marital status: Married     Spouse name: Not on file   . Number of children: Not on file   . Years of education: Not on file   . Highest education level: Not on file   Occupational History   . Not on file   Tobacco Use   . Smoking status: Former Games developer   . Smokeless tobacco: Never Used  Substance and Sexual Activity   . Alcohol use: Yes   . Drug use: No   . Sexual activity: Not on file   Other Topics Concern   . Military Service Not Asked   . Blood Transfusions Not Asked   . Caffeine Concern No   . Occupational Exposure Not Asked   . Hobby Hazards Not Asked   . Sleep Concern Not Asked   . Stress Concern Not Asked   . Weight Concern Not Asked   . Special Diet No   . Back Care Not Asked   . Exercises Regularly Yes   . Bike Helmet Use Not Asked   . Seat Belt Use Yes   . Performs Self-Exams Not Asked   Social History Narrative   . Not on file     Social Determinants of Health     Financial Resource Strain: Not on file   Food Insecurity: Not on file   Transportation Needs: Not on file   Physical Activity: Insufficiently Active   . Days of Exercise per Week: 2 days   . Minutes of Exercise per Session: 30 min   Stress: Not on file   Social Connections: Not on file   Intimate Partner Violence: Not on file   Housing Stability: Not on file     Social History     Tobacco Use   Smoking Status Former Smoker   Smokeless Tobacco Never Used      Social History     Substance and Sexual Activity   Alcohol Use Yes      Social History     Substance and Sexual Activity   Drug Use No        Immunization History   Administered Date(s) Administered   . COVID-19 (Moderna) Red Cap >= 12 Years 03/19/2020, 04/16/2020, 11/23/2020         CURRENT   MEDICATIONS:  Current Outpatient Medications on File Prior to Visit   Medication Sig Dispense Refill   . amLODIPINE (NORVASC) 5 MG tablet Take 1 tablet (5 mg) by mouth daily. 90 tablet 3   . aspirin 81 MG tablet Take 81 mg by mouth daily.     Marland Kitchen atorvastatin (LIPITOR) 40 MG tablet TAKE 1 TABLET BY MOUTH ONCE DAILY ( NEEDS APPT BEFORE NEXT REFILL) 90 tablet 3   . chlorthalidone (HYGROTEN) 50 MG tablet Take 1 tablet (50 mg) by mouth daily. 30 tablet 0   . clotrimazole-betamethasone (LOTRISONE) 1-0.05 % cream Apply 1 Application topically 2 times daily. Use a small amount as directed 1 each 0   . colchicine 0.6 MG tablet Take 2 pills x 1 dose, then 1 pill one hour later 30 tablet 0   . EUTHYROX 75 MCG tablet TAKE 1 TABLET BY MOUTH IN THE MORNING BEFORE BREAKFAST 90 tablet 3   . ketoconazole (NIZORAL) 2 % cream Apply 1 Application topically daily. Use a small amount as directed 1 each 1   . [DISCONTINUED] methylPREDNISolone (MEDROL DOSEPACK) 4 MG tablet Take as directed on package 1 each 0   . metoprolol succinate (TOPROL XL) 200 MG Extended-Release tablet Take 1 tablet (200 mg) by mouth daily. 90 tablet 2   . Misc Natural Products (TART CHERRY ADVANCED PO) Take by mouth 3 times daily.     . potassium chloride (KLOR-CON M20) 20 MEQ tablet Take 1 tablet (20 mEq) by mouth daily. 90 tablet 2   . sildenafil (VIAGRA) 50 MG tablet Take 1 tablet (50 mg) by mouth every  24 hours as needed for Erectile Dysfunction. 30 tablet 0     No current facility-administered medications on file prior to visit.     Outpatient Medications Marked as Taking for the 07/21/21 encounter (Office Visit) with Idali Lafever Chalk, NP   Medication Sig Dispense Refill   . amLODIPINE (NORVASC) 5 MG tablet Take 1 tablet (5 mg) by mouth daily. 90 tablet 3   . aspirin 81 MG tablet Take 81 mg by mouth daily.     Marland Kitchen atorvastatin (LIPITOR) 40 MG tablet TAKE 1 TABLET BY MOUTH ONCE DAILY ( NEEDS APPT BEFORE NEXT REFILL) 90 tablet 3   . chlorthalidone (HYGROTEN) 50 MG  tablet Take 1 tablet (50 mg) by mouth daily. 30 tablet 0   . clotrimazole-betamethasone (LOTRISONE) 1-0.05 % cream Apply 1 Application topically 2 times daily. Use a small amount as directed 1 each 0   . EUTHYROX 75 MCG tablet TAKE 1 TABLET BY MOUTH IN THE MORNING BEFORE BREAKFAST 90 tablet 3   . ketoconazole (NIZORAL) 2 % cream Apply 1 Application topically daily. Use a small amount as directed 1 each 1   . metoprolol succinate (TOPROL XL) 200 MG Extended-Release tablet Take 1 tablet (200 mg) by mouth daily. 90 tablet 2   . Misc Natural Products (TART CHERRY ADVANCED PO) Take by mouth 3 times daily.     . potassium chloride (KLOR-CON M20) 20 MEQ tablet Take 1 tablet (20 mEq) by mouth daily. 90 tablet 2   . sildenafil (VIAGRA) 50 MG tablet Take 1 tablet (50 mg) by mouth every 24 hours as needed for Erectile Dysfunction. 30 tablet 0        ALLERGIES:    No Known Allergies       REVIEW OF SYSTEMS:  Review of Systems   Constitutional:        Hip pain              PHYSICAL EXAM:   07/21/21  1535   BP: 146/84   Pulse: 55   Resp: 16   Temp: 98 F (36.7 C)   SpO2: 99%     Body mass index is 39.45 kg/m.    Ht Readings from Last 1 Encounters:   07/21/21 6\' 1"  (1.854 m)     Wt Readings from Last 1 Encounters:   07/21/21 135.6 kg (299 lb)           Physical Exam  Vitals and nursing note reviewed.   Constitutional:       General: He is not in acute distress.     Appearance: He is well-developed.   HENT:      Head: Normocephalic and atraumatic.   Cardiovascular:      Rate and Rhythm: Normal rate and regular rhythm.   Pulmonary:      Effort: Pulmonary effort is normal.   Musculoskeletal:      Comments: lbp right sciatic, stenosis lumbar   Skin:     General: Skin is warm and dry.   Neurological:      Mental Status: He is alert and oriented to person, place, and time. Mental status is at baseline.   Psychiatric:         Mood and Affect: Mood normal.         Behavior: Behavior normal.                  ASSESSMENT & PLAN:    Trew  was seen today for musculoskeletal problem.  Diagnoses and all orders for this visit:    Chronic right-sided low back pain with right-sided sciatica  Assessment & Plan:  Active  Start Tramadol 50 mg prn for pain  Start Flexeril 10 mg nightly prn  STAT MRI lumbar spine ordered  Form filled out for [employer]   Continue to monitor   Will notify pt of results    Orders:  -     MRI Lumbar Spine W/O Contrast  -     cyclobenzaprine (FLEXERIL) 10 MG tablet; 1 PO nightly 3 nights on, 4 nights off. Repeat if needed.  -     traMADol (ULTRAM) 50 MG tablet; Take 1 tablet (50 mg) by mouth every 6 hours as needed for Moderate Pain (Pain Score 4-6).    Spinal stenosis of lumbar region with neurogenic claudication  Assessment & Plan:  Active  Start Tramadol 50 mg prn for pain  Start Flexeril 10 mg nightly prn  STAT MRI lumbar spine ordered  Form filled out for [employer]   Continue to monitor   Will notify pt of results    Orders:  -     MRI Lumbar Spine W/O Contrast  -     cyclobenzaprine (FLEXERIL) 10 MG tablet; 1 PO nightly 3 nights on, 4 nights off. Repeat if needed.  -     traMADol (ULTRAM) 50 MG tablet; Take 1 tablet (50 mg) by mouth every 6 hours as needed for Moderate Pain (Pain Score 4-6).    Encounter for medication review  Assessment & Plan:  Progressing  Continue to monitor   F/u prn      Other orders  -     CURES Review Documentation - I Reviewed CURES        ICD-10-CM ICD-9-CM    1. Chronic right-sided low back pain with right-sided sciatica  M54.41 724.2 MRI Lumbar Spine W/O Contrast    G89.29 724.3 cyclobenzaprine (FLEXERIL) 10 MG tablet     338.29 traMADol (ULTRAM) 50 MG tablet   2. Spinal stenosis of lumbar region with neurogenic claudication  M48.062 724.03 MRI Lumbar Spine W/O Contrast      cyclobenzaprine (FLEXERIL) 10 MG tablet      traMADol (ULTRAM) 50 MG tablet   3. Encounter for medication review  Z79.899 V58.69              By signing below, I acknowledge that I have reviewed the above note, dictated by me and  scribed by [place], for accuracy and edited where necessary. The note accurately reflects the services provided at this encounter. We retain the right to modify this information in the event of errors. Occasional errors in punctuation, grammar and content may occur.    Electronically reviewed and signed by Hadleigh Felber Chalk, FNP Ph.D.      [facility]  [email or web address]

## 2021-07-22 ENCOUNTER — Telehealth (INDEPENDENT_AMBULATORY_CARE_PROVIDER_SITE_OTHER): Payer: Self-pay | Admitting: Family

## 2021-07-22 DIAGNOSIS — Z299 Encounter for prophylactic measures, unspecified: Secondary | ICD-10-CM

## 2021-07-22 MED ORDER — DIAZEPAM 5 MG OR TABS
ORAL_TABLET | ORAL | 0 refills | Status: DC
Start: 2021-07-22 — End: 2021-07-26

## 2021-07-23 ENCOUNTER — Ambulatory Visit (INDEPENDENT_AMBULATORY_CARE_PROVIDER_SITE_OTHER): Admitting: Family Medicine

## 2021-07-24 ENCOUNTER — Encounter (INDEPENDENT_AMBULATORY_CARE_PROVIDER_SITE_OTHER): Payer: Self-pay

## 2021-07-24 ENCOUNTER — Other Ambulatory Visit (INDEPENDENT_AMBULATORY_CARE_PROVIDER_SITE_OTHER): Payer: Self-pay | Admitting: Nurse Practitioner

## 2021-07-24 DIAGNOSIS — I10 Essential (primary) hypertension: Secondary | ICD-10-CM

## 2021-07-24 MED ORDER — CHLORTHALIDONE 50 MG OR TABS
ORAL_TABLET | ORAL | 0 refills | Status: DC
Start: 2021-07-24 — End: 2021-10-29

## 2021-07-25 ENCOUNTER — Other Ambulatory Visit (INDEPENDENT_AMBULATORY_CARE_PROVIDER_SITE_OTHER): Payer: Self-pay | Admitting: Family

## 2021-07-25 DIAGNOSIS — Z299 Encounter for prophylactic measures, unspecified: Secondary | ICD-10-CM

## 2021-07-26 ENCOUNTER — Encounter (INDEPENDENT_AMBULATORY_CARE_PROVIDER_SITE_OTHER): Payer: Self-pay | Admitting: Hospital

## 2021-07-26 MED ORDER — DIAZEPAM 5 MG OR TABS
ORAL_TABLET | ORAL | 0 refills | Status: AC
Start: 2021-07-26 — End: ?

## 2021-07-26 NOTE — Addendum Note (Signed)
 Addended by: Merrit Waugh Chalk on: 07/26/2021 12:52 PM     Modules accepted: Orders

## 2021-07-26 NOTE — Result Encounter Note (Signed)
 We got your results back already. Wow!  L2-3 2mm disc bulge, mod to severe left , mild right side, L3-4 5mm disc bulge, severe spinal stenosis, l4-5 mod disease w/ 5mm disc spur, mod to severe stenosis. L5-S1 4mm disc spur mod disease with severe stenosis.     WE will refer you to neurosurgery for treatment options.

## 2021-07-28 ENCOUNTER — Other Ambulatory Visit (INDEPENDENT_AMBULATORY_CARE_PROVIDER_SITE_OTHER): Payer: Self-pay | Admitting: Family

## 2021-07-28 DIAGNOSIS — M48062 Spinal stenosis, lumbar region with neurogenic claudication: Secondary | ICD-10-CM

## 2021-07-28 DIAGNOSIS — G8929 Other chronic pain: Secondary | ICD-10-CM

## 2021-08-03 ENCOUNTER — Encounter (INDEPENDENT_AMBULATORY_CARE_PROVIDER_SITE_OTHER): Payer: Self-pay | Admitting: Hospital

## 2021-08-06 ENCOUNTER — Other Ambulatory Visit (INDEPENDENT_AMBULATORY_CARE_PROVIDER_SITE_OTHER): Payer: Self-pay | Admitting: Family Medicine

## 2021-08-06 DIAGNOSIS — Z760 Encounter for issue of repeat prescription: Secondary | ICD-10-CM

## 2021-08-06 DIAGNOSIS — I10 Essential (primary) hypertension: Secondary | ICD-10-CM

## 2021-08-07 MED ORDER — POTASSIUM CHLORIDE CRYS CR 20 MEQ OR TBCR
EXTENDED_RELEASE_TABLET | ORAL | 2 refills | Status: AC
Start: 2021-08-07 — End: ?

## 2021-08-07 MED ORDER — METOPROLOL SUCCINATE 200 MG OR TB24
ORAL_TABLET | ORAL | 2 refills | Status: AC
Start: 2021-08-07 — End: ?

## 2021-08-10 ENCOUNTER — Encounter (INDEPENDENT_AMBULATORY_CARE_PROVIDER_SITE_OTHER): Payer: Self-pay

## 2021-08-10 ENCOUNTER — Other Ambulatory Visit (INDEPENDENT_AMBULATORY_CARE_PROVIDER_SITE_OTHER): Payer: Self-pay | Admitting: Hospital

## 2021-08-10 DIAGNOSIS — M48062 Spinal stenosis, lumbar region with neurogenic claudication: Secondary | ICD-10-CM

## 2021-08-10 DIAGNOSIS — G8929 Other chronic pain: Secondary | ICD-10-CM

## 2021-08-12 MED ORDER — TRAMADOL HCL 50 MG OR TABS
50.0000 mg | ORAL_TABLET | Freq: Four times a day (QID) | ORAL | 0 refills | Status: DC | PRN
Start: 2021-08-12 — End: 2021-09-13

## 2021-08-19 ENCOUNTER — Encounter (INDEPENDENT_AMBULATORY_CARE_PROVIDER_SITE_OTHER): Payer: Self-pay | Admitting: Hospital

## 2021-08-22 ENCOUNTER — Other Ambulatory Visit (INDEPENDENT_AMBULATORY_CARE_PROVIDER_SITE_OTHER): Payer: Self-pay | Admitting: Nurse Practitioner

## 2021-08-22 DIAGNOSIS — E785 Hyperlipidemia, unspecified: Secondary | ICD-10-CM

## 2021-08-24 ENCOUNTER — Encounter (INDEPENDENT_AMBULATORY_CARE_PROVIDER_SITE_OTHER): Payer: Self-pay | Admitting: Nurse Practitioner

## 2021-08-24 MED ORDER — ATORVASTATIN CALCIUM 20 MG OR TABS
20.0000 mg | ORAL_TABLET | Freq: Every day | ORAL | 3 refills | Status: AC
Start: 2021-08-24 — End: ?

## 2021-08-25 ENCOUNTER — Encounter (INDEPENDENT_AMBULATORY_CARE_PROVIDER_SITE_OTHER): Payer: Self-pay | Admitting: Hospital

## 2021-08-25 NOTE — Telephone Encounter (Signed)
 Pt called in regards to CPT codes. Pt jstated ENT would not schedule appointment unless CPT codes related to DX were on referral. Spoke with referrals. Issue should be resolved.

## 2021-08-26 ENCOUNTER — Telehealth (INDEPENDENT_AMBULATORY_CARE_PROVIDER_SITE_OTHER): Payer: Self-pay | Admitting: Family Medicine

## 2021-08-26 ENCOUNTER — Encounter (INDEPENDENT_AMBULATORY_CARE_PROVIDER_SITE_OTHER): Payer: Self-pay | Admitting: Family

## 2021-09-01 ENCOUNTER — Ambulatory Visit (INDEPENDENT_AMBULATORY_CARE_PROVIDER_SITE_OTHER): Admitting: Family

## 2021-09-01 ENCOUNTER — Encounter (INDEPENDENT_AMBULATORY_CARE_PROVIDER_SITE_OTHER): Payer: Self-pay | Admitting: Family

## 2021-09-01 VITALS — BP 166/110 | HR 59 | Temp 96.8°F | Resp 24 | Ht 73.0 in | Wt 285.6 lb

## 2021-09-01 DIAGNOSIS — M7989 Other specified soft tissue disorders: Secondary | ICD-10-CM | POA: Insufficient documentation

## 2021-09-01 DIAGNOSIS — Z79899 Other long term (current) drug therapy: Secondary | ICD-10-CM

## 2021-09-01 DIAGNOSIS — M48062 Spinal stenosis, lumbar region with neurogenic claudication: Secondary | ICD-10-CM

## 2021-09-01 DIAGNOSIS — I10 Essential (primary) hypertension: Secondary | ICD-10-CM

## 2021-09-01 MED ORDER — BETAMETHASONE DIPROPIONATE 0.05 % EX CREA
1.0000 | TOPICAL_CREAM | CUTANEOUS | Status: DC | PRN
Start: 2021-07-08 — End: 2021-11-18

## 2021-09-01 MED ORDER — GABAPENTIN 300 MG OR CAPS
300.0000 mg | ORAL_CAPSULE | Freq: Four times a day (QID) | ORAL | 2 refills | Status: AC
Start: 2021-09-01 — End: ?

## 2021-09-01 MED ORDER — HYDROCHLOROTHIAZIDE 25 MG OR TABS
25.0000 mg | ORAL_TABLET | Freq: Every day | ORAL | 2 refills | Status: DC
Start: 2021-09-01 — End: 2021-12-06

## 2021-09-01 NOTE — Assessment & Plan Note (Signed)
Worsening   Has upcoming appointment with neurosurgery in 09/2021  Start Neurotin 300 mg QID PRN   Continue other pain medications as directed   Cont to monitor   F/u with neurosurgery

## 2021-09-01 NOTE — Assessment & Plan Note (Signed)
Progressing- meds reviewed and refilled. F/u annually or prn

## 2021-09-01 NOTE — Assessment & Plan Note (Signed)
Worsening- start HCTZ 25 mg d. Keep legs elevated, compression stockings, diuretic ad, f/u if sx worsen.

## 2021-09-01 NOTE — Assessment & Plan Note (Signed)
Active-- start HCTZ 25 mg d. Continue other meds as prescribed. Cont to monitor bp qwk if controlled, cont meds as listed, exercise, limit salt intake,  if sbp < 101 on more than one occasion, call for bp dose change, f/u annually.

## 2021-09-13 ENCOUNTER — Other Ambulatory Visit (INDEPENDENT_AMBULATORY_CARE_PROVIDER_SITE_OTHER): Payer: Self-pay | Admitting: Family

## 2021-09-13 DIAGNOSIS — M48062 Spinal stenosis, lumbar region with neurogenic claudication: Secondary | ICD-10-CM

## 2021-09-13 DIAGNOSIS — G8929 Other chronic pain: Secondary | ICD-10-CM

## 2021-09-13 MED ORDER — TRAMADOL HCL 50 MG OR TABS
ORAL_TABLET | ORAL | 0 refills | Status: DC
Start: 2021-09-13 — End: 2021-10-06

## 2021-09-27 ENCOUNTER — Encounter (INDEPENDENT_AMBULATORY_CARE_PROVIDER_SITE_OTHER): Payer: Self-pay | Admitting: Family

## 2021-09-28 ENCOUNTER — Other Ambulatory Visit (INDEPENDENT_AMBULATORY_CARE_PROVIDER_SITE_OTHER): Payer: Self-pay | Admitting: Nurse Practitioner

## 2021-09-28 DIAGNOSIS — E039 Hypothyroidism, unspecified: Secondary | ICD-10-CM

## 2021-10-04 MED ORDER — LEVOTHYROXINE SODIUM 75 MCG OR TABS
ORAL_TABLET | ORAL | 3 refills | Status: AC
Start: 2021-10-04 — End: ?

## 2021-10-06 ENCOUNTER — Other Ambulatory Visit (INDEPENDENT_AMBULATORY_CARE_PROVIDER_SITE_OTHER): Payer: Self-pay | Admitting: Family

## 2021-10-06 DIAGNOSIS — G8929 Other chronic pain: Secondary | ICD-10-CM

## 2021-10-06 DIAGNOSIS — M48062 Spinal stenosis, lumbar region with neurogenic claudication: Secondary | ICD-10-CM

## 2021-10-06 MED ORDER — TRAMADOL HCL 50 MG OR TABS
ORAL_TABLET | ORAL | 0 refills | Status: DC
Start: 2021-10-06 — End: 2021-10-29

## 2021-10-13 ENCOUNTER — Encounter (INDEPENDENT_AMBULATORY_CARE_PROVIDER_SITE_OTHER): Payer: Self-pay | Admitting: Family

## 2021-10-13 ENCOUNTER — Encounter (INDEPENDENT_AMBULATORY_CARE_PROVIDER_SITE_OTHER): Payer: Self-pay

## 2021-10-13 NOTE — Result Encounter Note (Signed)
 Your back shows:bulging discs, stenosis, arthritis, bone spurs and moderate to sever spine stenosis. I will refer you to a neurosurgeon for treatment options.     L2-L3: Moderate bilateral facet disease and a 2 mm disc bulge resulting in   moderate to severe left and mild right-sided neural foramen stenosis with mild   to moderate spinal canal stenosis.     L3-L4: 5 mm disc bulge with moderate bilateral facet disease. There is a T2   hyperintense cystic focus associated ligamentum flavum on the right side   measuring 5 mm. Moderate to severe spinal canal and bilateral neural foramen   stenosis.     L4-L5: Moderate bilateral facet disease and a 5 mm disc ridge complex   resulting in moderate to severe spinal canal and bilateral neural foramen   stenosis.     L5-S1: Status post left-sided hemilaminotomy. 4 mm disc ridge complex with   moderate bilateral facet disease resulting in moderate to severe bilateral   neural foramen stenosis without spinal canal stenosis.

## 2021-10-15 ENCOUNTER — Other Ambulatory Visit (INDEPENDENT_AMBULATORY_CARE_PROVIDER_SITE_OTHER): Payer: Self-pay

## 2021-10-15 ENCOUNTER — Encounter (INDEPENDENT_AMBULATORY_CARE_PROVIDER_SITE_OTHER): Payer: Self-pay | Admitting: Family

## 2021-10-19 ENCOUNTER — Encounter (INDEPENDENT_AMBULATORY_CARE_PROVIDER_SITE_OTHER): Payer: Self-pay

## 2021-10-20 ENCOUNTER — Encounter (INDEPENDENT_AMBULATORY_CARE_PROVIDER_SITE_OTHER): Payer: Self-pay | Admitting: Hospital

## 2021-10-24 ENCOUNTER — Other Ambulatory Visit (INDEPENDENT_AMBULATORY_CARE_PROVIDER_SITE_OTHER): Payer: Self-pay | Admitting: Nurse Practitioner

## 2021-10-24 DIAGNOSIS — I10 Essential (primary) hypertension: Secondary | ICD-10-CM

## 2021-10-26 ENCOUNTER — Other Ambulatory Visit (INDEPENDENT_AMBULATORY_CARE_PROVIDER_SITE_OTHER): Payer: Self-pay | Admitting: Nurse Practitioner

## 2021-10-29 ENCOUNTER — Other Ambulatory Visit (INDEPENDENT_AMBULATORY_CARE_PROVIDER_SITE_OTHER): Payer: Self-pay | Admitting: Family

## 2021-10-29 DIAGNOSIS — G8929 Other chronic pain: Secondary | ICD-10-CM

## 2021-10-29 DIAGNOSIS — I10 Essential (primary) hypertension: Secondary | ICD-10-CM

## 2021-10-29 DIAGNOSIS — M48062 Spinal stenosis, lumbar region with neurogenic claudication: Secondary | ICD-10-CM

## 2021-10-29 MED ORDER — TRAMADOL HCL 50 MG OR TABS
50.0000 mg | ORAL_TABLET | Freq: Four times a day (QID) | ORAL | 0 refills | Status: DC | PRN
Start: 2021-10-29 — End: 2021-11-17

## 2021-10-29 MED ORDER — CHLORTHALIDONE 50 MG OR TABS
50.0000 mg | ORAL_TABLET | Freq: Every day | ORAL | 3 refills | Status: AC
Start: 2021-10-29 — End: ?

## 2021-11-13 ENCOUNTER — Other Ambulatory Visit (INDEPENDENT_AMBULATORY_CARE_PROVIDER_SITE_OTHER): Payer: Self-pay | Admitting: Family

## 2021-11-13 DIAGNOSIS — M48062 Spinal stenosis, lumbar region with neurogenic claudication: Secondary | ICD-10-CM

## 2021-11-13 DIAGNOSIS — G8929 Other chronic pain: Secondary | ICD-10-CM

## 2021-11-17 MED ORDER — TRAMADOL HCL 50 MG OR TABS
ORAL_TABLET | ORAL | 0 refills | Status: DC
Start: 2021-11-17 — End: 2021-12-27

## 2021-11-18 ENCOUNTER — Other Ambulatory Visit (INDEPENDENT_AMBULATORY_CARE_PROVIDER_SITE_OTHER): Payer: Self-pay | Admitting: Family Medicine

## 2021-11-18 DIAGNOSIS — L309 Dermatitis, unspecified: Secondary | ICD-10-CM

## 2021-11-19 MED ORDER — BETAMETHASONE DIPROPIONATE 0.05 % EX CREA
1.0000 | TOPICAL_CREAM | CUTANEOUS | 0 refills | Status: AC | PRN
Start: 2021-11-19 — End: ?

## 2021-12-05 ENCOUNTER — Other Ambulatory Visit (INDEPENDENT_AMBULATORY_CARE_PROVIDER_SITE_OTHER): Payer: Self-pay | Admitting: Family

## 2021-12-05 DIAGNOSIS — I10 Essential (primary) hypertension: Secondary | ICD-10-CM

## 2021-12-05 DIAGNOSIS — M7989 Other specified soft tissue disorders: Secondary | ICD-10-CM

## 2021-12-06 MED ORDER — HYDROCHLOROTHIAZIDE 25 MG OR TABS
ORAL_TABLET | ORAL | 2 refills | Status: AC
Start: 2021-12-06 — End: ?

## 2021-12-27 ENCOUNTER — Other Ambulatory Visit (INDEPENDENT_AMBULATORY_CARE_PROVIDER_SITE_OTHER): Payer: Self-pay | Admitting: Family

## 2021-12-27 DIAGNOSIS — M48062 Spinal stenosis, lumbar region with neurogenic claudication: Secondary | ICD-10-CM

## 2021-12-27 DIAGNOSIS — G8929 Other chronic pain: Secondary | ICD-10-CM

## 2021-12-27 MED ORDER — TRAMADOL HCL 50 MG OR TABS
ORAL_TABLET | ORAL | 0 refills | Status: AC
Start: 2021-12-27 — End: ?

## 2022-01-08 ENCOUNTER — Other Ambulatory Visit (INDEPENDENT_AMBULATORY_CARE_PROVIDER_SITE_OTHER): Payer: Self-pay | Admitting: Medical

## 2022-01-08 DIAGNOSIS — G8929 Other chronic pain: Secondary | ICD-10-CM

## 2022-01-08 DIAGNOSIS — M48062 Spinal stenosis, lumbar region with neurogenic claudication: Secondary | ICD-10-CM

## 2022-01-10 ENCOUNTER — Other Ambulatory Visit (INDEPENDENT_AMBULATORY_CARE_PROVIDER_SITE_OTHER): Payer: Self-pay | Admitting: Family

## 2022-01-10 DIAGNOSIS — M48062 Spinal stenosis, lumbar region with neurogenic claudication: Secondary | ICD-10-CM

## 2022-01-10 DIAGNOSIS — G8929 Other chronic pain: Secondary | ICD-10-CM

## 2022-01-11 MED ORDER — TRAMADOL HCL 50 MG OR TABS
50.0000 mg | ORAL_TABLET | Freq: Four times a day (QID) | ORAL | 0 refills | Status: AC | PRN
Start: 2022-01-11 — End: ?

## 2022-01-13 ENCOUNTER — Ambulatory Visit (INDEPENDENT_AMBULATORY_CARE_PROVIDER_SITE_OTHER): Admitting: Physician Assistant

## 2022-02-14 ENCOUNTER — Encounter (INDEPENDENT_AMBULATORY_CARE_PROVIDER_SITE_OTHER): Payer: Self-pay

## 2023-03-24 ENCOUNTER — Other Ambulatory Visit (INDEPENDENT_AMBULATORY_CARE_PROVIDER_SITE_OTHER): Payer: Self-pay | Admitting: Nurse Practitioner

## 2023-03-24 DIAGNOSIS — E785 Hyperlipidemia, unspecified: Secondary | ICD-10-CM

## 2023-03-24 NOTE — Telephone Encounter (Signed)
Per pt is no longer with [facility]

## 2023-03-28 ENCOUNTER — Other Ambulatory Visit (INDEPENDENT_AMBULATORY_CARE_PROVIDER_SITE_OTHER): Payer: Self-pay | Admitting: Nurse Practitioner

## 2023-03-28 DIAGNOSIS — E785 Hyperlipidemia, unspecified: Secondary | ICD-10-CM

## 2023-03-31 ENCOUNTER — Other Ambulatory Visit (INDEPENDENT_AMBULATORY_CARE_PROVIDER_SITE_OTHER): Payer: Self-pay | Admitting: Nurse Practitioner

## 2023-03-31 DIAGNOSIS — E785 Hyperlipidemia, unspecified: Secondary | ICD-10-CM

## 2023-03-31 NOTE — Telephone Encounter (Signed)
Pt's medication refill request denied. Pt needs to be seen. Please contact pt to assist with appt scheduling. thanks

## 2023-05-11 ENCOUNTER — Encounter (INDEPENDENT_AMBULATORY_CARE_PROVIDER_SITE_OTHER): Payer: Self-pay | Admitting: Hospital
